# Patient Record
Sex: Male | Born: 1945 | Race: White | Hispanic: No | Marital: Single | Smoking: Former smoker
Health system: Southern US, Community
[De-identification: ages and names within clinical notes are randomized; demographics above are authoritative.]

## PROBLEM LIST (undated history)

## (undated) DIAGNOSIS — R0600 Dyspnea, unspecified: Secondary | ICD-10-CM

## (undated) DIAGNOSIS — R011 Cardiac murmur, unspecified: Secondary | ICD-10-CM

## (undated) DIAGNOSIS — J449 Chronic obstructive pulmonary disease, unspecified: Secondary | ICD-10-CM

## (undated) DIAGNOSIS — M1990 Unspecified osteoarthritis, unspecified site: Secondary | ICD-10-CM

## (undated) DIAGNOSIS — I6529 Occlusion and stenosis of unspecified carotid artery: Secondary | ICD-10-CM

## (undated) DIAGNOSIS — F419 Anxiety disorder, unspecified: Secondary | ICD-10-CM

## (undated) DIAGNOSIS — I959 Hypotension, unspecified: Secondary | ICD-10-CM

## (undated) HISTORY — PX: BLADDER SURGERY: SHX569

## (undated) HISTORY — PX: MOUTH SURGERY: SHX715

## (undated) HISTORY — DX: Occlusion and stenosis of unspecified carotid artery: I65.29

## (undated) HISTORY — DX: Chronic obstructive pulmonary disease, unspecified: J44.9

## (undated) HISTORY — DX: Hypotension, unspecified: I95.9

---

## 2017-08-01 ENCOUNTER — Encounter: Payer: Self-pay | Admitting: Internal Medicine

## 2017-08-01 ENCOUNTER — Telehealth: Payer: Self-pay

## 2017-08-01 ENCOUNTER — Ambulatory Visit (INDEPENDENT_AMBULATORY_CARE_PROVIDER_SITE_OTHER): Payer: Medicare Other | Admitting: Internal Medicine

## 2017-08-01 VITALS — BP 134/84 | HR 99 | Temp 97.7°F | Ht 67.0 in | Wt 115.4 lb

## 2017-08-01 DIAGNOSIS — G8929 Other chronic pain: Secondary | ICD-10-CM

## 2017-08-01 DIAGNOSIS — Z1211 Encounter for screening for malignant neoplasm of colon: Secondary | ICD-10-CM

## 2017-08-01 DIAGNOSIS — R5381 Other malaise: Secondary | ICD-10-CM | POA: Diagnosis not present

## 2017-08-01 DIAGNOSIS — R0609 Other forms of dyspnea: Secondary | ICD-10-CM | POA: Insufficient documentation

## 2017-08-01 DIAGNOSIS — Z136 Encounter for screening for cardiovascular disorders: Secondary | ICD-10-CM

## 2017-08-01 DIAGNOSIS — M549 Dorsalgia, unspecified: Secondary | ICD-10-CM | POA: Diagnosis not present

## 2017-08-01 DIAGNOSIS — R8290 Unspecified abnormal findings in urine: Secondary | ICD-10-CM

## 2017-08-01 DIAGNOSIS — R053 Chronic cough: Secondary | ICD-10-CM

## 2017-08-01 DIAGNOSIS — Z23 Encounter for immunization: Secondary | ICD-10-CM | POA: Diagnosis not present

## 2017-08-01 DIAGNOSIS — R011 Cardiac murmur, unspecified: Secondary | ICD-10-CM | POA: Diagnosis not present

## 2017-08-01 DIAGNOSIS — I739 Peripheral vascular disease, unspecified: Secondary | ICD-10-CM | POA: Diagnosis not present

## 2017-08-01 DIAGNOSIS — Z720 Tobacco use: Secondary | ICD-10-CM

## 2017-08-01 DIAGNOSIS — R0989 Other specified symptoms and signs involving the circulatory and respiratory systems: Secondary | ICD-10-CM | POA: Diagnosis not present

## 2017-08-01 DIAGNOSIS — R05 Cough: Secondary | ICD-10-CM

## 2017-08-01 LAB — URINALYSIS, ROUTINE W REFLEX MICROSCOPIC
BACTERIA UA: NONE SEEN /HPF
Bilirubin Urine: NEGATIVE
Glucose, UA: NEGATIVE
Hyaline Cast: NONE SEEN /LPF
KETONES UR: NEGATIVE
LEUKOCYTES UA: NEGATIVE
Nitrite: NEGATIVE
PROTEIN: NEGATIVE
SPECIFIC GRAVITY, URINE: 1.014 (ref 1.001–1.03)
SQUAMOUS EPITHELIAL / LPF: NONE SEEN /HPF (ref <=5)
WBC, UA: NONE SEEN /HPF (ref 0–5)
pH: 5.5 (ref 5.0–8.0)

## 2017-08-01 LAB — COMPLETE METABOLIC PANEL WITH GFR
AG RATIO: 1.2 (calc) (ref 1.0–2.5)
ALT: 9 U/L (ref 9–46)
AST: 15 U/L (ref 10–35)
Albumin: 4.1 g/dL (ref 3.6–5.1)
Alkaline phosphatase (APISO): 109 U/L (ref 40–115)
BILIRUBIN TOTAL: 0.3 mg/dL (ref 0.2–1.2)
BUN: 13 mg/dL (ref 7–25)
CALCIUM: 9.8 mg/dL (ref 8.6–10.3)
CHLORIDE: 100 mmol/L (ref 98–110)
CO2: 29 mmol/L (ref 20–32)
Creat: 1.04 mg/dL (ref 0.70–1.18)
GFR, EST NON AFRICAN AMERICAN: 72 mL/min/{1.73_m2} (ref >=60)
GFR, Est African American: 83 mL/min/{1.73_m2} (ref >=60)
GLUCOSE: 89 mg/dL (ref 65–99)
Globulin: 3.3 g/dL (calc) (ref 1.9–3.7)
POTASSIUM: 4.4 mmol/L (ref 3.5–5.3)
Sodium: 136 mmol/L (ref 135–146)
Total Protein: 7.4 g/dL (ref 6.1–8.1)

## 2017-08-01 LAB — CBC WITH DIFFERENTIAL/PLATELET
BASOS PCT: 1.2 %
Basophils Absolute: 90 cells/uL (ref 0–200)
Eosinophils Absolute: 128 cells/uL (ref 15–500)
Eosinophils Relative: 1.7 %
HCT: 44.1 % (ref 38.5–50.0)
Hemoglobin: 15.4 g/dL (ref 13.2–17.1)
Lymphs Abs: 1860 cells/uL (ref 850–3900)
MCH: 28.8 pg (ref 27.0–33.0)
MCHC: 34.9 g/dL (ref 32.0–36.0)
MCV: 82.4 fL (ref 80.0–100.0)
MONOS PCT: 10.7 %
MPV: 10.7 fL (ref 7.5–12.5)
NEUTROS PCT: 61.6 %
Neutro Abs: 4620 cells/uL (ref 1500–7800)
PLATELETS: 134 10*3/uL — AB (ref 140–400)
RBC: 5.35 10*6/uL (ref 4.20–5.80)
RDW: 12.8 % (ref 11.0–15.0)
TOTAL LYMPHOCYTE: 24.8 %
WBC: 7.5 10*3/uL (ref 3.8–10.8)
WBCMIX: 803 {cells}/uL (ref 200–950)

## 2017-08-01 LAB — LIPID PANEL
CHOLESTEROL: 202 mg/dL — AB (ref <200)
HDL: 52 mg/dL (ref >40)
LDL Cholesterol (Calc): 129 mg/dL (calc) — ABNORMAL HIGH
Non-HDL Cholesterol (Calc): 150 mg/dL (calc) — ABNORMAL HIGH (ref <130)
TRIGLYCERIDES: 105 mg/dL (ref <150)
Total CHOL/HDL Ratio: 3.9 (calc) (ref <5.0)

## 2017-08-01 LAB — TSH: TSH: 0.45 mIU/L (ref 0.40–4.50)

## 2017-08-01 NOTE — Patient Instructions (Signed)
Influenza vaccine given today  Will call with lab results and imaging study results  Will call with US appt  Follow up in 1 month for AWV/EV with MMSE and ECG

## 2017-08-01 NOTE — Telephone Encounter (Signed)
Spoke with Friend per emergancy contact she stated that patient has not seen a provider since 1990's.

## 2017-08-01 NOTE — Telephone Encounter (Signed)
Patient's friend Peter Castaneda stopped at the front desk to schedule follow-up appointment and inquired about getting patient linked to resources in the community such as meals on wheels.   I briefly informed Peter Castaneda about our [facility]3 Guide who is connected to resources in the community and she was interested and requested referral    Please review and complete pending referral if in agreement with request

## 2017-08-01 NOTE — Progress Notes (Signed)
Patient ID: Peter Castaneda, male   DOB: 1945-10-17, 71 y.o.   MRN: [id number]    Location:  [facility] Place of Service: OFFICE    Advanced Directive information    Chief Complaint  Patient presents with  . Establish Care    New Patient to establish care, Here with caregiver  . Immunizations    discuss vaccs     HPI:  71 yo male seen today as a new pt. He states he is unable to live alone anymore due to failing health. He has noticed over the last 6 mos, he is unable to walk >30 ft without getting SOB. He has difficulty performing ADLs (bathing, dressing, grooming, moving room-room, etc). He smokes 1 ppd x 64yr [facility] Family friend Celene present.  Hx bladder polyp s/p partial cystectomy - no further gross hematuria  Prior to retiring, he worked with classified gMedical laboratory scientific officer Past Medical History:  Diagnosis Date  . Low blood pressure    Entered from new patient packet     Past Surgical History:  Procedure Laterality Date  . BLADDER REMOVAL     Part of bladder removed, 1980's. Input from New Patient Packet     Patient Care Team: CGildardo Cranker DO as PCP - General (Internal Medicine)  Social History   Socioeconomic History  . Marital status: Single    Spouse name: Not on file  . Number of children: Not on file  . Years of education: Not on file  . Highest education level: Not on file  Social Needs  . Financial resource strain: Not on file  . Food insecurity - worry: Not on file  . Food insecurity - inability: Not on file  . Transportation needs - medical: Not on file  . Transportation needs - non-medical: Not on file  Occupational History  . Not on file  Tobacco Use  . Smoking status: Current Every Day Smoker    Packs/day: 1.00    Years: 50.00    Pack years: 50.00  . Smokeless tobacco: Never Used  Substance and Sexual Activity  . Alcohol use: No    Frequency: Never  . Drug use: No  . Sexual activity: Not on file  Other  Topics Concern  . 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 Yes   Do you have difficulty preparing food or eating? Yes   Do you have difficulty managing your medications? No   Do you have difficulty managing your finances? Yes   Do you have difficulty affording your medications? No     reports that he has been smoking.  He has a 50.00 pack-year smoking history. he has never used smokeless tobacco. He reports that he does not drink alcohol or use drugs.  Family History  Problem Relation  Age of Onset  . Heart disease Mother   . Cancer Mother   . Alzheimer's disease Father    Family Status  Relation Name Status  . Mother Stanton Kidney Deceased  . Father Eulas Post Deceased       Step Father  . Sister Bluford Kaufmann  . Brother Herbie Baltimore Alive     There is no immunization history on file for this patient.  No Known Allergies  Medications:   Medication List        Accurate as of 08/01/17  9:25 AM. Always use your most recent med list.          aspirin 325 MG EC tablet       Review of Systems  Constitutional: Positive for fatigue and unexpected weight change (wt loss).  HENT: Positive for dental problem (dentures), postnasal drip and sinus pressure.   Eyes: Positive for visual disturbance (wears eyeglasses).  Respiratory: Positive for cough.   Cardiovascular:       Leg pain with ambulation that resolves when he rests  Gastrointestinal:       Hx ulcer  Genitourinary: Positive for urgency.       Weak stream  Musculoskeletal: Positive for back  pain.  Skin:       Nail abnormality  Allergic/Immunologic: Positive for environmental allergies.  Neurological: Positive for weakness.  All other systems reviewed and are negative.   Vitals:   08/01/17 0854  BP: 134/84  Pulse: 99  Temp: 97.7 F (36.5 C)  TempSrc: Oral  SpO2: 96%  Weight: 115 lb 6.4 oz (52.3 kg)  Height: 5' 7"  (1.702 m)   Body mass index is 18.07 kg/m.  Physical Exam  Constitutional: He is oriented to person, place, and time. He appears well-developed.  Frail appearing in NAD  HENT:  Mouth/Throat: Oropharynx is clear and moist.  Poor dentition; MMM; no oral thrush  Eyes: Pupils are equal, round, and reactive to light. No scleral icterus.  Neck: Neck supple. Carotid bruit is not present. No thyromegaly present.  Cardiovascular: Normal rate, regular rhythm and intact distal pulses. Exam reveals no gallop and no friction rub.  Murmur (1/6 SEM) heard. Pulses:      Dorsalis pedis pulses are 0 on the right side, and 0 on the left side.       Posterior tibial pulses are 0 on the right side, and 0 on the left side.  No LE edema b/l. No calf TTP  Pulmonary/Chest: Effort normal. He has wheezes (end expiratory with prolonged expiratory phase). He has no rales. He exhibits no tenderness.  Abdominal: Soft. Normal appearance and bowel sounds are normal. He exhibits no distension, no abdominal bruit, no pulsatile midline mass and no mass. There is no hepatomegaly. There is no tenderness. There is no rigidity, no rebound and no guarding. No hernia.  Musculoskeletal: He exhibits edema.  Lymphadenopathy:    He has no cervical adenopathy.  Neurological: He is alert and oriented to person, place, and time. He has normal reflexes.  Skin: Skin is warm and dry. No rash noted.  Psychiatric: He has a normal mood and affect. His behavior is normal. Judgment and thought content normal.     Labs reviewed: No results found for any previous visit.    No results  found.   Assessment/Plan   ICD-10-CM   1. 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 Weak arterial pulse R09.89 VAS [place] ABI WITH/WO TBI  3. Chronic cough -  likely 2/2 #5 R05 DG Chest 2 View  4. Dyspnea on exertion - likely 2/2 #5 R06.09 DG Chest 2 View    CBC with Differential/Platelets    CMP with eGFR    TSH  5. Continuous tobacco abuse Z72.0 DG Chest 2 View  6. Physical deconditioning R53.81   7. Chronic back pain, unspecified back location, unspecified back pain laterality M54.9 Urinalysis with Reflex Microscopic   G89.29   8. Colon cancer screening Z12.11 Ambulatory referral to Gastroenterology  9. Screening for cardiovascular condition Z13.6 Lipid Panel  10. Cardiac murmur R01.1   11. Need for immunization against influenza Z23 Flu Vaccine QUAD 6+ mos PF IM (Fluarix Quad PF)    Will likely need 2D echo to further evaluate heart murmur  Influenza vaccine given today  Will call with lab results and imaging study results  Will call with [place] appt  Follow up in 1 month for AWV/EV with MMSE and ECG    Grant Henkes S. Perlie Gold  [facility] [address] [place], [place] [address] [phone] Cell (Monday-Friday 8 AM - 5 PM) [phone] After 5 PM and follow prompts

## 2017-08-03 ENCOUNTER — Encounter: Payer: Self-pay | Admitting: Physician Assistant

## 2017-08-13 ENCOUNTER — Ambulatory Visit: Payer: Self-pay | Admitting: Physician Assistant

## 2017-08-22 ENCOUNTER — Ambulatory Visit (INDEPENDENT_AMBULATORY_CARE_PROVIDER_SITE_OTHER): Payer: Medicare Other | Admitting: Physician Assistant

## 2017-08-22 ENCOUNTER — Encounter: Payer: Self-pay | Admitting: Physician Assistant

## 2017-08-22 VITALS — BP 88/60 | HR 116 | Ht 66.0 in | Wt 114.0 lb

## 2017-08-22 DIAGNOSIS — R1310 Dysphagia, unspecified: Secondary | ICD-10-CM | POA: Diagnosis not present

## 2017-08-22 DIAGNOSIS — R634 Abnormal weight loss: Secondary | ICD-10-CM

## 2017-08-22 DIAGNOSIS — Z1212 Encounter for screening for malignant neoplasm of rectum: Secondary | ICD-10-CM

## 2017-08-22 DIAGNOSIS — Z1211 Encounter for screening for malignant neoplasm of colon: Secondary | ICD-10-CM | POA: Diagnosis not present

## 2017-08-22 NOTE — Progress Notes (Signed)
Reviewed and agree with initial management plan.  Alyze Lauf T. Lory Nowaczyk, MD FACG 

## 2017-08-22 NOTE — Progress Notes (Signed)
Chief Complaint: Weight loss, dysphagia  HPI:    Mr. Peter Castaneda is a 71 year old Caucasian male with a past medical history as listed below, who was referred to me by Kirt Boysarter, Monica, DO for a complaint of dysphagia and weight loss.      Per review of chart patient is being worked up for claudication as he recently went to see a primary care provider for the first time" many years".    Patient presents to clinic accompanied by his caretaker and friend who does assist with his history.  Today, the patient explains that he has dropped from around 145 pounds down to 114 over the past 5 years ever since he retired from his job and had a "decrease in funds".  The patient tells me that he is just a "nibbler" and never eats a very large meal and does not have the money to afford large meals.  Patient denies any nausea, vomiting, heartburn, reflux or epigastric pain.  Patient tells me that he is hungry but does not have food to eat.    Patient does describe occasional dysphagia which happens "may be a couple times a week", the patient describes food getting stuck in his throat when he swallows it.  This typically will go down eventually.    Patient does describe that he "maybe had a colonoscopy about 30 years ago", at the same time that he had a cystoscopy to remove a benign tumor from his bladder.  He is not sure if this was done or not and "I zoned out when they started telling me everything they were going to do".    Patient denies fever, chills, blood in his stool, melena, change in bowel habits or abdominal pain.  Past Medical History:  Diagnosis Date  . Low blood pressure    Entered from new patient packet     Past Surgical History:  Procedure Laterality Date  . BLADDER SURGERY     tumor removal    Current Outpatient Medications  Medication Sig Dispense Refill  . multivitamin-iron-minerals-folic acid (CENTRUM) chewable tablet Chew 1 tablet by mouth daily.    Marland Kitchen. aspirin 325 MG EC tablet Take 325  mg daily by mouth.     No current facility-administered medications for this visit.     Allergies as of 08/22/2017  . (No Known Allergies)    Family History  Problem Relation Age of Onset  . Heart disease Mother   . Cancer Mother        Unknown type  . Alzheimer's disease Father   . Rectal cancer Neg Hx   . Colon cancer Neg Hx   . Stomach cancer Neg Hx     Social History   Socioeconomic History  . Marital status: Single    Spouse name: Not on file  . Number of children: Not on file  . Years of education: Not on file  . Highest education level: Not on file  Social Needs  . Financial resource strain: Not on file  . Food insecurity - worry: Not on file  . Food insecurity - inability: Not on file  . Transportation needs - medical: Not on file  . Transportation needs - non-medical: Not on file  Occupational History  . Not on file  Tobacco Use  . Smoking status: Current Every Day Smoker    Packs/day: 1.00    Years: 50.00    Pack years: 50.00  . Smokeless tobacco: Never Used  Substance and Sexual Activity  .  Alcohol use: No    Frequency: Never  . Drug use: No  . Sexual activity: Not Currently    Partners: Female  Other Topics Concern  . Not on file  Social History Narrative   As of 07/24/17:      Diet: N/A      Caffeine: Coffee      Married, if yes what year: N/A      Do you live in a house, apartment, assisted living, condo, trailer, ect: 5th wheel trailer, 1 stories, 1 person       Pets: 1 dog      Current/Past profession: tool and Scientist, forensicdie maker, Art gallery managerengineer       Exercise: No         Living Will: No   DNR: No   POA/HPOA: No      Functional Status:   Do you have difficulty bathing or dressing yourself? Yes   Do you have difficulty preparing food or eating? Yes   Do you have difficulty managing your medications? No   Do you have difficulty managing your finances? Yes   Do you have difficulty affording your medications? No    Review of Systems:      Constitutional: No fever or chills Skin: No rash  Cardiovascular: No chest pain Respiratory: No SOB Gastrointestinal: See HPI and otherwise negative Genitourinary: No dysuria Neurological: No headache Musculoskeletal: B./l leg numbness Hematologic: No bleeding or bruising Psychiatric: No history of depression or anxiety   Physical Exam:  Vital signs: BP (!) 88/60   Pulse (!) 116   Ht 5\' 6"  (1.676 m)   Wt 114 lb (51.7 kg)   BMI 18.40 kg/m   Constitutional:   Pleasant frail appearing Caucasian male appears to be in NAD, Well developed, alert and cooperative Head:  Normocephalic and atraumatic. Eyes:   PEERL, EOMI. No icterus. Conjunctiva pink. Ears:  Normal auditory acuity. Neck:  Supple Throat: Oral cavity and pharynx without inflammation, swelling or lesion. Poor dentition Respiratory: Respirations even and unlabored. Lungs clear to auscultation bilaterally.   No wheezes, crackles, or rhonchi.  Cardiovascular: Normal S1, S2. + murmur Regular rate and rhythm. No peripheral edema, cyanosis or pallor.  Gastrointestinal:  Soft, nondistended, nontender. No rebound or guarding. Normal bowel sounds. No appreciable masses or hepatomegaly. Rectal:  Not performed.  Msk:  Symmetrical without gross deformities. Without edema, no deformity or joint abnormality.  Neurologic:  Alert and  oriented x4;  grossly normal neurologically.  Skin:   Dry and intact without significant lesions or rashes. Psychiatric: Demonstrates good judgement and reason without abnormal affect or behaviors.  RELEVANT LABS AND IMAGING: CBC    Component Value Date/Time   WBC 7.5 08/01/2017 1010   RBC 5.35 08/01/2017 1010   HGB 15.4 08/01/2017 1010   HCT 44.1 08/01/2017 1010   PLT 134 (L) 08/01/2017 1010   MCV 82.4 08/01/2017 1010   MCH 28.8 08/01/2017 1010   MCHC 34.9 08/01/2017 1010   RDW 12.8 08/01/2017 1010   LYMPHSABS 1,860 08/01/2017 1010   EOSABS 128 08/01/2017 1010   BASOSABS 90 08/01/2017 1010     CMP     Component Value Date/Time   NA 136 08/01/2017 1010   K 4.4 08/01/2017 1010   CL 100 08/01/2017 1010   CO2 29 08/01/2017 1010   GLUCOSE 89 08/01/2017 1010   BUN 13 08/01/2017 1010   CREATININE 1.04 08/01/2017 1010   CALCIUM 9.8 08/01/2017 1010   PROT 7.4 08/01/2017 1010   AST  15 08/01/2017 1010   ALT 9 08/01/2017 1010   BILITOT 0.3 08/01/2017 1010   GFRNONAA 72 08/01/2017 1010   GFRAA 83 08/01/2017 1010    Assessment: 1.  Weight loss: About 30 pounds per the patient over the past 5 years since retiring from his job due to " decrease in funds"; I believe that this is most likely related to socioeconomic status and decreased ability to purchase food rather than GI symptoms 2.  Screening colonoscopy: Patient describes one may be 30 years ago 3.  New cardiac murmur and claudication: Currently undergoing workup, has follow with primary care provider in a month 4.  Dysphagia: Intermittent, 2 times a week, food gets stuck; consider esophageal stricture versus ring versus web versus mass  Plan: 1.  Discussed with the patient that due to finding of a new heart murmur and claudication by his PCP recently, would recommend that he have this all worked up before we schedule him for any anesthetic procedures. 2.  We will go ahead and order a barium swallow study with tablet to see if patient may benefit from an EGD in the future. 3.  Discussed patient's weight loss in depth, it appears this is just related to a decrease of intake 4.  Patient will need a colonoscopy in the future, will discuss at time of follow-up after imaging above.  Patient may follow with me.  He was assigned to Dr. Russella Dar today.  Hyacinth Meeker, PA-C [facility] 08/22/2017, 3:25 PM  Cc: Kirt Boys, DO

## 2017-08-22 NOTE — Patient Instructions (Signed)
You have been scheduled for a Barium Esophogram at [facility] ([address]) on 08/30/17 at 845 am. Please arrive 15 minutes prior to your appointment for registration, bring your insurance card and photo id. Make certain not to have anything to eat or drink 6 hours prior to your test. If you need to reschedule for any reason, please contact radiology at [phone] to do so. __________________________________________________________________ A barium swallow is an examination that concentrates on views of the esophagus. This tends to be a double contrast exam (barium and two liquids which, when combined, create a gas to distend the wall of the oesophagus) or single contrast (non-ionic iodine based). The study is usually tailored to your symptoms so a good history is essential. Attention is paid during the study to the form, structure and configuration of the esophagus, looking for functional disorders (such as aspiration, dysphagia, achalasia, motility and reflux) EXAMINATION You may be asked to change into a gown, depending on the type of swallow being performed. A radiologist and radiographer will perform the procedure. The radiologist will advise you of the type of contrast selected for your procedure and direct you during the exam. You will be asked to stand, sit or lie in several different positions and to hold a small amount of fluid in your mouth before being asked to swallow while the imaging is performed .In some instances you may be asked to swallow barium coated marshmallows to assess the motility of a solid food bolus. The exam can be recorded as a digital or video fluoroscopy procedure. POST PROCEDURE It will take 1-2 days for the barium to pass through your system. To facilitate this, it is important, unless otherwise directed, to increase your fluids for the next 24-48hrs and to resume your normal diet.  This test typically takes about 30 minutes to  perform. __________________________________________________________________________________

## 2017-08-23 ENCOUNTER — Encounter (HOSPITAL_COMMUNITY): Payer: Self-pay

## 2017-08-23 ENCOUNTER — Ambulatory Visit: Payer: Self-pay | Admitting: Physician Assistant

## 2017-08-24 ENCOUNTER — Ambulatory Visit (HOSPITAL_COMMUNITY)
Admission: RE | Admit: 2017-08-24 | Discharge: 2017-08-24 | Disposition: A | Discharge status: Home / Self Care | Payer: Medicare Other | Source: Ambulatory Visit | Attending: Internal Medicine | Admitting: Internal Medicine

## 2017-08-24 DIAGNOSIS — R0989 Other specified symptoms and signs involving the circulatory and respiratory systems: Secondary | ICD-10-CM | POA: Diagnosis not present

## 2017-08-24 DIAGNOSIS — I739 Peripheral vascular disease, unspecified: Secondary | ICD-10-CM | POA: Insufficient documentation

## 2017-08-24 DIAGNOSIS — F17210 Nicotine dependence, cigarettes, uncomplicated: Secondary | ICD-10-CM | POA: Insufficient documentation

## 2017-08-24 DIAGNOSIS — E785 Hyperlipidemia, unspecified: Secondary | ICD-10-CM | POA: Insufficient documentation

## 2017-08-30 ENCOUNTER — Ambulatory Visit
Admission: RE | Admit: 2017-08-30 | Discharge: 2017-08-30 | Disposition: A | Discharge status: Home / Self Care | Payer: Medicare Other | Source: Ambulatory Visit | Attending: Internal Medicine | Admitting: Internal Medicine

## 2017-08-30 ENCOUNTER — Ambulatory Visit
Admission: RE | Admit: 2017-08-30 | Discharge: 2017-08-30 | Disposition: A | Discharge status: Home / Self Care | Payer: Medicare Other | Source: Ambulatory Visit | Attending: Physician Assistant | Admitting: Physician Assistant

## 2017-08-30 DIAGNOSIS — R0609 Other forms of dyspnea: Secondary | ICD-10-CM

## 2017-08-30 DIAGNOSIS — R05 Cough: Secondary | ICD-10-CM | POA: Diagnosis not present

## 2017-08-30 DIAGNOSIS — Z720 Tobacco use: Secondary | ICD-10-CM

## 2017-08-30 DIAGNOSIS — R1310 Dysphagia, unspecified: Secondary | ICD-10-CM | POA: Diagnosis not present

## 2017-08-30 DIAGNOSIS — R053 Chronic cough: Secondary | ICD-10-CM

## 2017-08-30 DIAGNOSIS — R634 Abnormal weight loss: Secondary | ICD-10-CM

## 2017-09-03 ENCOUNTER — Encounter: Payer: Self-pay | Admitting: Internal Medicine

## 2017-09-03 DIAGNOSIS — J449 Chronic obstructive pulmonary disease, unspecified: Secondary | ICD-10-CM | POA: Insufficient documentation

## 2017-09-07 ENCOUNTER — Ambulatory Visit (INDEPENDENT_AMBULATORY_CARE_PROVIDER_SITE_OTHER): Payer: Medicare Other

## 2017-09-07 VITALS — BP 118/60 | HR 116 | Temp 97.3°F | Ht 66.0 in | Wt 117.0 lb

## 2017-09-07 DIAGNOSIS — E639 Nutritional deficiency, unspecified: Secondary | ICD-10-CM

## 2017-09-07 DIAGNOSIS — Z0000 Encounter for general adult medical examination without abnormal findings: Secondary | ICD-10-CM

## 2017-09-07 DIAGNOSIS — Z23 Encounter for immunization: Secondary | ICD-10-CM

## 2017-09-07 NOTE — Patient Instructions (Signed)
Mr. Peter Castaneda , Thank you for taking time to come for your Medicare Wellness Visit. I appreciate your ongoing commitment to your health goals. Please review the following plan we discussed and let me know if I can assist you in the future.   Screening recommendations/referrals: Colonoscopy due, please discuss with Dr Montez Moritaarter when you come next week Recommended yearly ophthalmology/optometry visit for glaucoma screening and checkup Recommended yearly dental visit for hygiene and checkup  Vaccinations: Influenza vaccine up to date. Due 2019 fall season Pneumococcal vaccine 13 given today. PNA 23 due 09/07/2018 Tdap vaccine due, will wait Shingles vaccine due, will wait    Advanced directives: Advance directive discussed with you today. I have provided a copy for you to complete at home and have notarized. Once this is complete please bring a copy in to our office so we can scan it into your chart.  Conditions/risks identified: None  Next appointment: Dr. Montez Moritaarter 09/11/2017 @ 2:45pm            Tyron RussellSara Saunders, RN 09/09/2018 @ 12:45pm  Preventive Care 65 Years and Older, Male Preventive care refers to lifestyle choices and visits with your health care provider that can promote health and wellness. What does preventive care include?  A yearly physical exam. This is also called an annual well check.  Dental exams once or twice a year.  Routine eye exams. Ask your health care provider how often you should have your eyes checked.  Personal lifestyle choices, including:  Daily care of your teeth and gums.  Regular physical activity.  Eating a healthy diet.  Avoiding tobacco and drug use.  Limiting alcohol use.  Practicing safe sex.  Taking low doses of aspirin every day.  Taking vitamin and mineral supplements as recommended by your health care provider. What happens during an annual well check? The services and screenings done by your health care provider during your annual well check  will depend on your age, overall health, lifestyle risk factors, and family history of disease. Counseling  Your health care provider may ask you questions about your:  Alcohol use.  Tobacco use.  Drug use.  Emotional well-being.  Home and relationship well-being.  Sexual activity.  Eating habits.  History of falls.  Memory and ability to understand (cognition).  Work and work Astronomerenvironment. Screening  You may have the following tests or measurements:  Height, weight, and BMI.  Blood pressure.  Lipid and cholesterol levels. These may be checked every 5 years, or more frequently if you are over 72 years old.  Skin check.  Lung cancer screening. You may have this screening every year starting at age 72 if you have a 30-pack-year history of smoking and currently smoke or have quit within the past 15 years.  Fecal occult blood test (FOBT) of the stool. You may have this test every year starting at age 72.  Flexible sigmoidoscopy or colonoscopy. You may have a sigmoidoscopy every 5 years or a colonoscopy every 10 years starting at age 72.  Prostate cancer screening. Recommendations will vary depending on your family history and other risks.  Hepatitis C blood test.  Hepatitis B blood test.  Sexually transmitted disease (STD) testing.  Diabetes screening. This is done by checking your blood sugar (glucose) after you have not eaten for a while (fasting). You may have this done every 1-3 years.  Abdominal aortic aneurysm (AAA) screening. You may need this if you are a current or former smoker.  Osteoporosis. You may be screened  starting at age 56 if you are at high risk. Talk with your health care provider about your test results, treatment options, and if necessary, the need for more tests. Vaccines  Your health care provider may recommend certain vaccines, such as:  Influenza vaccine. This is recommended every year.  Tetanus, diphtheria, and acellular pertussis  (Tdap, Td) vaccine. You may need a Td booster every 10 years.  Zoster vaccine. You may need this after age 29.  Pneumococcal 13-valent conjugate (PCV13) vaccine. One dose is recommended after age 13.  Pneumococcal polysaccharide (PPSV23) vaccine. One dose is recommended after age 63. Talk to your health care provider about which screenings and vaccines you need and how often you need them. This information is not intended to replace advice given to you by your health care provider. Make sure you discuss any questions you have with your health care provider. Document Released: 09/17/2015 Document Revised: 05/10/2016 Document Reviewed: 06/22/2015 Elsevier Interactive Patient Education  2017 [employer].  Fall Prevention in the Home Falls can cause injuries. They can happen to people of all ages. There are many things you can do to make your home safe and to help prevent falls. What can I do on the outside of my home?  Regularly fix the edges of walkways and driveways and fix any cracks.  Remove anything that might make you trip as you walk through a door, such as a raised step or threshold.  Trim any bushes or trees on the path to your home.  Use bright outdoor lighting.  Clear any walking paths of anything that might make someone trip, such as rocks or tools.  Regularly check to see if handrails are loose or broken. Make sure that both sides of any steps have handrails.  Any raised decks and porches should have guardrails on the edges.  Have any leaves, snow, or ice cleared regularly.  Use sand or salt on walking paths during winter.  Clean up any spills in your garage right away. This includes oil or grease spills. What can I do in the bathroom?  Use night lights.  Install grab bars by the toilet and in the tub and shower. Do not use towel bars as grab bars.  Use non-skid mats or decals in the tub or shower.  If you need to sit down in the shower, use a plastic, non-slip  stool.  Keep the floor dry. Clean up any water that spills on the floor as soon as it happens.  Remove soap buildup in the tub or shower regularly.  Attach bath mats securely with double-sided non-slip rug tape.  Do not have throw rugs and other things on the floor that can make you trip. What can I do in the bedroom?  Use night lights.  Make sure that you have a light by your bed that is easy to reach.  Do not use any sheets or blankets that are too big for your bed. They should not hang down onto the floor.  Have a firm chair that has side arms. You can use this for support while you get dressed.  Do not have throw rugs and other things on the floor that can make you trip. What can I do in the kitchen?  Clean up any spills right away.  Avoid walking on wet floors.  Keep items that you use a lot in easy-to-reach places.  If you need to reach something above you, use a strong step stool that has a grab  bar.  Keep electrical cords out of the way.  Do not use floor polish or wax that makes floors slippery. If you must use wax, use non-skid floor wax.  Do not have throw rugs and other things on the floor that can make you trip. What can I do with my stairs?  Do not leave any items on the stairs.  Make sure that there are handrails on both sides of the stairs and use them. Fix handrails that are broken or loose. Make sure that handrails are as long as the stairways.  Check any carpeting to make sure that it is firmly attached to the stairs. Fix any carpet that is loose or worn.  Avoid having throw rugs at the top or bottom of the stairs. If you do have throw rugs, attach them to the floor with carpet tape.  Make sure that you have a light switch at the top of the stairs and the bottom of the stairs. If you do not have them, ask someone to add them for you. What else can I do to help prevent falls?  Wear shoes that:  Do not have high heels.  Have rubber bottoms.  Are  comfortable and fit you well.  Are closed at the toe. Do not wear sandals.  If you use a stepladder:  Make sure that it is fully opened. Do not climb a closed stepladder.  Make sure that both sides of the stepladder are locked into place.  Ask someone to hold it for you, if possible.  Clearly mark and make sure that you can see:  Any grab bars or handrails.  First and last steps.  Where the edge of each step is.  Use tools that help you move around (mobility aids) if they are needed. These include:  Canes.  Walkers.  Scooters.  Crutches.  Turn on the lights when you go into a dark area. Replace any light bulbs as soon as they burn out.  Set up your furniture so you have a clear path. Avoid moving your furniture around.  If any of your floors are uneven, fix them.  If there are any pets around you, be aware of where they are.  Review your medicines with your doctor. Some medicines can make you feel dizzy. This can increase your chance of falling. Ask your doctor what other things that you can do to help prevent falls. This information is not intended to replace advice given to you by your health care provider. Make sure you discuss any questions you have with your health care provider. Document Released: 06/17/2009 Document Revised: 01/27/2016 Document Reviewed: 09/25/2014 Elsevier Interactive Patient Education  2017 [employer].

## 2017-09-07 NOTE — Progress Notes (Signed)
Subjective:   Peter Castaneda is a 72 y.o. male who presents for an Initial Medicare Annual Wellness Visit.      Objective:    Today's Vitals   09/07/17 1109  BP: 118/60  Pulse: (!) 116  Temp: (!) 97.3 F (36.3 C)  TempSrc: Oral  SpO2: 97%  Weight: 117 lb (53.1 kg)  Height: 5\' 6"  (1.676 m)   Body mass index is 18.88 kg/m.  Advanced Directives 09/07/2017  Does Patient Have a Medical Advance Directive? No  Would patient like information on creating a medical advance directive? Yes (MAU/Ambulatory/Procedural Areas - Information given)    Current Medications (verified) Outpatient Encounter Medications as of 09/07/2017  Medication Sig  . aspirin 325 MG EC tablet Take 325 mg daily by mouth.  . multivitamin-iron-minerals-folic acid (CENTRUM) chewable tablet Chew 1 tablet by mouth daily.   No facility-administered encounter medications on file as of 09/07/2017.     Allergies (verified) Patient has no known allergies.   History: Past Medical History:  Diagnosis Date  . Low blood pressure    Entered from new patient packet    Past Surgical History:  Procedure Laterality Date  . BLADDER SURGERY     tumor removal   Family History  Problem Relation Age of Onset  . Heart disease Mother   . Cancer Mother        Unknown type  . Alzheimer's disease Father   . Rectal cancer Neg Hx   . Colon cancer Neg Hx   . Stomach cancer Neg Hx    Social History   Socioeconomic History  . Marital status: Single    Spouse name: Not on file  . Number of children: Not on file  . Years of education: Not on file  . Highest education level: Not on file  Social Needs  . Financial resource strain: Not hard at all  . Food insecurity - worry: Never true  . Food insecurity - inability: Never true  . Transportation needs - medical: No  . Transportation needs - non-medical: No  Occupational History  . Not on file  Tobacco Use  . Smoking status: Current Every Day Smoker    Packs/day: 1.00      Years: 50.00    Pack years: 50.00  . Smokeless tobacco: Never Used  Substance and Sexual Activity  . Alcohol use: No    Frequency: Never  . Drug use: No  . Sexual activity: Not Currently    Partners: Female  Other Topics Concern  . Not on file  Social History Narrative   As of 07/24/17:      Diet: N/A      Caffeine: Coffee      Married, if yes what year: N/A      Do you live in a house, apartment, assisted living, condo, trailer, ect: 5th wheel trailer, 1 stories, 1 person       Pets: 1 dog      Current/Past profession: tool and Scientist, forensic, Art gallery manager       Exercise: No         Living Will: No   DNR: No   POA/HPOA: No      Functional Status:   Do you have difficulty bathing or dressing yourself? Yes   Do you have difficulty preparing food or eating? Yes   Do you have difficulty managing your medications? No   Do you have difficulty managing your finances? Yes   Do you have difficulty affording your medications?  No   Tobacco Counseling Ready to quit: Not Answered Counseling given: Not Answered   Clinical Intake:  Pre-visit preparation completed: No        Nutritional Risks: None  How often do you need to have someone help you when you read instructions, pamphlets, or other written materials from your doctor or pharmacy?: 2 - Rarely What is the last grade level you completed in school?: Some College  Interpreter Needed?: No  Information entered by :: Tyron Russell, RN  Activities of Daily Living In your present state of health, do you have any difficulty performing the following activities: 09/07/2017  Hearing? N  Vision? N  Difficulty concentrating or making decisions? N  Walking or climbing stairs? Y  Dressing or bathing? N  Doing errands, shopping? N  Preparing Food and eating ? N  Using the Toilet? N  In the past six months, have you accidently leaked urine? Y  Do you have problems with loss of bowel control? N  Managing your Medications? N   Managing your Finances? N  Housekeeping or managing your Housekeeping? N     Immunizations and Health Maintenance Immunization History  Administered Date(s) Administered  . Influenza,inj,Quad PF,6+ Mos 08/01/2017   Health Maintenance Due  Topic Date Due  . Hepatitis C Screening  [birth year]  . TETANUS/TDAP  11/15/1964  . COLONOSCOPY  11/16/1995  . PNA vac Low Risk Adult (1 of 2 - PCV13) 11/16/2010    Patient Care Team: Kirt Boys, DO as PCP - General (Internal Medicine)  Indicate any recent Medical Services you may have received from other than [facility] providers in the past year (date may be approximate).    Assessment:   This is a routine wellness examination for Peter Castaneda.  Hearing/Vision screen Hearing Screening Comments: Pt has no issues with hearing Vision Screening Comments: Patient does not see eye doctor but will make appointment  Dietary issues and exercise activities discussed: Current Exercise Habits: The patient does not participate in regular exercise at present, Exercise limited by: orthopedic condition(s)  Goals    . Patient Stated     Patient would like to stop smoking and increase walking.      Depression Screen PHQ 2/9 Scores 09/07/2017 08/01/2017  PHQ - 2 Score 0 0    Fall Risk Fall Risk  09/07/2017 08/01/2017  Falls in the past year? Yes Yes  Number falls in past yr: 1 2 or more  Injury with Fall? No No    Is the patient's home free of loose throw rugs in walkways, pet beds, electrical cords, etc?   yes      Grab bars in the bathroom? yes      Handrails on the stairs?   yes      Adequate lighting?   yes  Timed Get Up and Go performed: 20 seconds, fall risk  Cognitive Function:        Screening Tests Health Maintenance  Topic Date Due  . Hepatitis C Screening  1946/06/06  . TETANUS/TDAP  11/15/1964  . COLONOSCOPY  11/16/1995  . PNA vac Low Risk Adult (1 of 2 - PCV13) 11/16/2010  . INFLUENZA VACCINE  Completed    Qualifies for  Shingles Vaccine? Yes, educated and wants to wait to get it  Cancer Screenings: Lung: Low Dose CT Chest recommended if Age 24-80 years, 30 pack-year currently smoking OR have quit w/in 15years. Patient does qualify. Colorectal: due, in process of making appoinmtnet   Additional Screenings:  Hepatitis B/HIV/Syphillis: not  indicated Hepatitis C Screening: due, wants to discuss with doctor      Plan:    I have personally reviewed and addressed the Medicare Annual Wellness questionnaire and have noted the following in the patient's chart:  A. Medical and social history B. Use of alcohol, tobacco or illicit drugs  C. Current medications and supplements D. Functional ability and status E.  Nutritional status F.  Physical activity G. Advance directives H. List of other physicians I.  Hospitalizations, surgeries, and ER visits in previous 12 months J.  Vitals K. Screenings to include hearing, vision, cognitive, depression L. Referrals and appointments - none  In addition, I have reviewed and discussed with patient certain preventive protocols, quality metrics, and best practice recommendations. A written personalized care plan for preventive services as well as general preventive health recommendations were provided to patient.  See attached scanned questionnaire for additional information.   Signed,   Tyron RussellSara Saunders, RN Nurse Health Advisor   Quick Notes   Health Maintenance: Hep C screen, tdap and shingrix due, wants to wait. In process of scheduling colonoscopy. PNA 13 given today.      Abnormal Screen: Pulse 116. MMSE 27/30. Passed clock drawing     Patient Concerns: Would like to discuss colonoscopy and endoscopy appointments. Pt [facility]    Nurse Concerns: Smoking-pt stated he would like to quit and requested smoking cessation information.

## 2017-09-11 ENCOUNTER — Ambulatory Visit (INDEPENDENT_AMBULATORY_CARE_PROVIDER_SITE_OTHER): Payer: Medicare Other | Admitting: Internal Medicine

## 2017-09-11 ENCOUNTER — Encounter: Payer: Self-pay | Admitting: Internal Medicine

## 2017-09-11 VITALS — BP 102/60 | HR 111 | Temp 97.5°F | Resp 12 | Ht 66.0 in | Wt 115.0 lb

## 2017-09-11 DIAGNOSIS — Z7189 Other specified counseling: Secondary | ICD-10-CM

## 2017-09-11 DIAGNOSIS — R000 Tachycardia, unspecified: Secondary | ICD-10-CM

## 2017-09-11 DIAGNOSIS — I779 Disorder of arteries and arterioles, unspecified: Secondary | ICD-10-CM | POA: Diagnosis not present

## 2017-09-11 DIAGNOSIS — R5381 Other malaise: Secondary | ICD-10-CM | POA: Diagnosis not present

## 2017-09-11 DIAGNOSIS — I739 Peripheral vascular disease, unspecified: Secondary | ICD-10-CM

## 2017-09-11 DIAGNOSIS — R011 Cardiac murmur, unspecified: Secondary | ICD-10-CM | POA: Diagnosis not present

## 2017-09-11 DIAGNOSIS — I4510 Unspecified right bundle-branch block: Secondary | ICD-10-CM | POA: Diagnosis not present

## 2017-09-11 DIAGNOSIS — F17209 Nicotine dependence, unspecified, with unspecified nicotine-induced disorders: Secondary | ICD-10-CM | POA: Diagnosis not present

## 2017-09-11 DIAGNOSIS — Z716 Tobacco abuse counseling: Secondary | ICD-10-CM

## 2017-09-11 DIAGNOSIS — Z720 Tobacco use: Secondary | ICD-10-CM

## 2017-09-11 DIAGNOSIS — J449 Chronic obstructive pulmonary disease, unspecified: Secondary | ICD-10-CM

## 2017-09-11 MED ORDER — ALBUTEROL SULFATE HFA 108 (90 BASE) MCG/ACT IN AERS: 2.0000 | INHALATION_SPRAY | RESPIRATORY_TRACT | 6 refills | Status: DC | PRN

## 2017-09-11 MED ORDER — BUPROPION HCL ER (SR) 150 MG PO TB12: ORAL_TABLET | ORAL | 1 refills | Status: DC

## 2017-09-11 NOTE — Patient Instructions (Addendum)
START WELLBUTRIN SR 150MG  take 1 tab daily x 3 days then increase 1 tab 2 times daily  START RESCUE INHALER (ALBUTEROL HFA) to use as needed for cough, difficulty breathing and wehezing  Will call with vascular surgery appt  Will call with heart US appt  Continue Aspirin daily  Follow up in 1 month for tobacco abuse, PAOD, murmur

## 2017-09-11 NOTE — Progress Notes (Signed)
Patient ID: Peter Castaneda, male   DOB: 1945/11/12, 72 y.o.   MRN: [id number]   Location:  [facility]  Place of Service:  OFFICE  Provider: Elmon KirschnerMONICA S Rodrecus Belsky, DO  Patient Care Team: Kirt Boysarter, Antrell Tipler, DO as PCP - General (Internal Medicine)  Extended Emergency Contact Information Primary Emergency Contact: Flonnie HailstoneSelene, Barbara Home Phone: [phone] Relation: Friend  Code Status: FULL CODE Goals of Care: Advanced Directive information Advanced Directives 09/07/2017  Does Patient Have a Medical Advance Directive? No  Would patient like information on creating a medical advance directive? Yes (MAU/Ambulatory/Procedural Areas - Information given)     Chief Complaint  Patient presents with  . Medical Management of Chronic Issues    Extended visit, AWV completed on 09/07/17, MMSE 27/30, - depression, - fall risk. Here with friend Britta MccreedyBarbara  . Smoking Concerns    Discuss stop smoking   . Advance Care Planning    HPOA/Living Will     HPI: Patient is a 72 y.o. male seen in today for an extended visit. Reviewed AWV note from 09/07/17. MMSE 27/30. He saw GI and barium swallow revealed esophageal dysmotility and small sliding HH with inducible GE reflux.   He states he is unable to live alone anymore due to failing health. He has noticed over the last 6 mos, he is unable to walk >30 ft without getting SOB. He has difficulty performing ADLs (bathing, dressing, grooming, moving room-room, etc). He smokes 1 ppd x 5054yr. [facility] Family friend Celene present.  Hx bladder polyp s/p partial cystectomy - no further gross hematuria  Prior to retiring, he worked with [employer] contracts   [facility] 09/07/2017 08/01/2017  Decreased Interest 0 0  Down, Depressed, Hopeless 0 0  PHQ - 2 Score 0 0    Fall Risk  09/11/2017 09/07/2017 08/01/2017  Falls in the past year? Yes Yes Yes  Number falls in past yr: 1 1 2  or more  Injury with Fall? No No No   MMSE - Mini Mental  State Exam 09/07/2017  Orientation to time 5  Orientation to Place 5  Registration 3  Attention/ Calculation 4  Recall 2  Language- name 2 objects 2  Language- repeat 1  Language- follow 3 step command 2  Language- read & follow direction 1  Write a sentence 1  Copy design 1  Total score 27     Health Maintenance  Topic Date Due  . Hepatitis C Screening  01947/03/11  . TETANUS/TDAP  11/15/1964  . COLONOSCOPY  11/16/1995  . PNA vac Low Risk Adult (2 of 2 - PPSV23) 09/07/2018  . INFLUENZA VACCINE  Completed    Past Medical History:  Diagnosis Date  . Low blood pressure    Entered from new patient packet     Past Surgical History:  Procedure Laterality Date  . BLADDER SURGERY     tumor removal    Family History  Problem Relation Age of Onset  . Heart disease Mother   . Cancer Mother        Unknown type  . Alzheimer's disease Father   . Rectal cancer Neg Hx   . Colon cancer Neg Hx   . Stomach cancer Neg Hx    Family Status  Relation Name Status  . Mother Corrie DandyMary Deceased  . Father Sherrine MaplesGlenn Deceased       Step Father  . Sister Jason NestBarbara Alive  . Brother ToysRusobert Alive  . Neg Hx  (  Not Specified)    Social History   Socioeconomic History  . Marital status: Single    Spouse name: Not on file  . Number of children: Not on file  . Years of education: Not on file  . Highest education level: Not on file  Social Needs  . Financial resource strain: Not hard at all  . Food insecurity - worry: Never true  . Food insecurity - inability: Never true  . Transportation needs - medical: No  . Transportation needs - non-medical: No  Occupational History  . Not on file  Tobacco Use  . Smoking status: Current Every Day Smoker    Packs/day: 1.00    Years: 50.00    Pack years: 50.00  . Smokeless tobacco: Never Used  Substance and Sexual Activity  . Alcohol use: No    Frequency: Never  . Drug use: No  . Sexual activity: Not Currently    Partners: Female  Other Topics  Concern  . Not on file  Social History Narrative   As of 07/24/17:      Diet: N/A      Caffeine: Coffee      Married, if yes what year: N/A      Do you live in a house, apartment, assisted living, condo, trailer, ect: 5th wheel trailer, 1 stories, 1 person       Pets: 1 dog      Current/Past profession: tool and Scientist, forensic, Art gallery manager       Exercise: No         Living Will: No   DNR: No   POA/HPOA: No      Functional Status:   Do you have difficulty bathing or dressing yourself? Yes   Do you have difficulty preparing food or eating? Yes   Do you have difficulty managing your medications? No   Do you have difficulty managing your finances? Yes   Do you have difficulty affording your medications? No    No Known Allergies  Allergies as of 09/11/2017   No Known Allergies     Medication List        Accurate as of 09/11/17  3:21 PM. Always use your most recent med list.          aspirin 325 MG EC tablet Take 325 mg daily by mouth.   multivitamin-iron-minerals-folic acid chewable tablet Chew 1 tablet by mouth daily.        Review of Systems:  Review of Systems  HENT: Positive for trouble swallowing.   Respiratory: Positive for shortness of breath.   Musculoskeletal: Positive for arthralgias and gait problem.  All other systems reviewed and are negative.   Physical Exam: Vitals:   09/11/17 1442  BP: 102/60  Pulse: (!) 111  Resp: 12  Temp: (!) 97.5 F (36.4 C)  TempSrc: Oral  SpO2: 95%  Weight: 115 lb (52.2 kg)  Height: 5\' 6"  (1.676 m)   Body mass index is 18.56 kg/m. Physical Exam  Constitutional: He is oriented to person, place, and time. He appears well-developed and well-nourished. No distress.  Frail appearing in NAD  HENT:  Head: Normocephalic and atraumatic.  Right Ear: External ear normal.  Left Ear: External ear normal.  Mouth/Throat: Oropharynx is clear and moist. No oropharyngeal exudate.  MMM; no oral thrush  Eyes: EOM are normal.  Pupils are equal, round, and reactive to light. No scleral icterus.  Neck: Normal range of motion. Neck supple. Carotid bruit is not present. No tracheal deviation  present. No thyromegaly present.  Cardiovascular: Normal rate, regular rhythm and intact distal pulses. Exam reveals no gallop and no friction rub.  Murmur (1/6 SEM) heard. Pulses:      Carotid pulses are on the left side with bruit.      Dorsalis pedis pulses are 1+ on the right side, and 0 on the left side.       Posterior tibial pulses are 1+ on the right side, and 0 on the left side.  No LE edema b/l. No calf TTP  Pulmonary/Chest: Effort normal. No respiratory distress. He has wheezes (end expiratory with prolonged expiratory phase). He has no rales. He exhibits no tenderness.  No rhonchi  Abdominal: Soft. Bowel sounds are normal. He exhibits no distension and no mass. There is no hepatosplenomegaly or hepatomegaly. There is no tenderness. There is no rebound and no guarding. No hernia. Hernia confirmed negative in the right inguinal area and confirmed negative in the left inguinal area.  Genitourinary: Testes normal and penis normal. Right testis shows no mass, no swelling and no tenderness. Left testis shows no mass, no swelling and no tenderness. Circumcised.  Genitourinary Comments: Chaperone present  Musculoskeletal: He exhibits edema. He exhibits no deformity.  Lymphadenopathy:    He has no cervical adenopathy.       Right: No inguinal adenopathy present.       Left: No inguinal adenopathy present.  Neurological: He is alert and oriented to person, place, and time. He has normal reflexes.  Skin: Skin is warm and dry. No rash noted.  Psychiatric: He has a normal mood and affect. His behavior is normal. Judgment normal.  Vitals reviewed.   Labs reviewed:  Basic Metabolic Panel: Recent Labs    08/01/17 1010  NA 136  K 4.4  CL 100  CO2 29  GLUCOSE 89  BUN 13  CREATININE 1.04  CALCIUM 9.8  TSH 0.45   Liver  Function Tests: Recent Labs    08/01/17 1010  AST 15  ALT 9  BILITOT 0.3  PROT 7.4   No results for input(s): LIPASE, AMYLASE in the last 8760 hours. No results for input(s): AMMONIA in the last 8760 hours. CBC: Recent Labs    08/01/17 1010  WBC 7.5  NEUTROABS 4,620  HGB 15.4  HCT 44.1  MCV 82.4  PLT 134*   Lipid Panel: Recent Labs    08/01/17 1010  CHOL 202*  HDL 52  TRIG 105  CHOLHDL 3.9   No results found for: HGBA1C  Procedures: Dg Chest 2 View  Result Date: 08/30/2017 CLINICAL DATA:  Cough and congestion EXAM: CHEST  2 VIEW COMPARISON:  None. FINDINGS: Cardiac shadow is within normal limits. Aortic calcifications are noted. Lungs are hyperinflated consistent with COPD. Biapical pleuroparenchymal scarring is noted. No focal infiltrate or sizable effusion is seen. No acute bony abnormality is noted. IMPRESSION: COPD without acute abnormality. Electronically Signed   By: Alcide Clever M.D.   On: 08/30/2017 10:18   Dg Esophagus  Result Date: 08/30/2017 CLINICAL DATA:  Dysphagia. EXAM: ESOPHOGRAM/BARIUM SWALLOW TECHNIQUE: Single contrast examination was performed using  thin barium. FLUOROSCOPY TIME:  Fluoroscopy Time:  2 minutes and 12 seconds Radiation Exposure Index (if provided by the fluoroscopic device): 49 mGy Number of Acquired Spot Images: 0 COMPARISON:  None. FINDINGS: Initial barium swallows demonstrate normal pharyngeal motion with swallowing. No laryngeal penetration or aspiration. No upper esophageal webs, strictures or diverticuli. Moderate to advanced degenerative cervical spondylosis with multilevel disc disease and facet  disease. Anterior spurring changes without significant mass effect on the cervical esophagus. Esophageal dysmotility with occasional disruption of the primary peristaltic wave and occasional tertiary contractions. No intrinsic or extrinsic lesions are identified. No mucosal abnormalities. There is a small sliding-type hiatal hernia and  inducible GE reflux with water swallowing. The 13 mm barium pill passed into the stomach without difficulty. IMPRESSION: 1. Esophageal dysmotility. 2. Small sliding-type hiatal hernia with inducible GE reflux. 3. No mass or stricture. Electronically Signed   By: Rudie Meyer M.D.   On: 08/30/2017 09:54   ECG OBTAINED AND REVIEWED BY MYSELF: ST @ 108 bpm, nml axis, RBBB, poor R wave progression. No acute ischemic changes. No other ECG available to compare  Assessment/Plan   ICD-10-CM   1. Continuous tobacco abuse Z72.0 ECHOCARDIOGRAM COMPLETE  2. PAOD (peripheral arterial occlusive disease) (HCC) I77.9 Ambulatory referral to Vascular Surgery    ECHOCARDIOGRAM COMPLETE  3. Chronic obstructive pulmonary disease, unspecified COPD type (HCC) J44.9 albuterol (PROVENTIL HFA;VENTOLIN HFA) 108 (90 Base) MCG/ACT inhaler  4. Tachycardia R00.0 EKG 12-Lead    ECHOCARDIOGRAM COMPLETE  5. 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 Physical deconditioning R53.81   7. RBBB (right bundle branch block) I45.10 ECHOCARDIOGRAM COMPLETE  8. Cardiac murmur R01.1 ECHOCARDIOGRAM COMPLETE  9. Tobacco abuse counseling Z71.6   10. Tobacco use disorder, continuous F17.209   11. Encounter for smoking cessation counseling Z71.6   12. Advanced directives, counseling/discussion Z71.89     Discussed smoking cessation and tx options - he is willing to try pharmacologic tx. Discussed potential complications of smoking. TIME SPENT > 10 min.  START WELLBUTRIN SR 150MG  take 1 tab daily x 3 days then increase 1 tab 2 times daily  START RESCUE INHALER (ALBUTEROL HFA) to use as needed for cough, difficulty breathing and wehezing  Advanced directives discussed in detail - DNR vs FULL CoDE; MOST form; HCPOA, living will. He prefers FULL CODE at this time unless it is determined that he will not recover. Form provided. He will complete and notarize and return to the office. TIME  SPENT >30 min  Will call with vascular surgery appt  Will call with heart [place] appt  Continue Aspirin daily  FL2 form completed  Follow up in 1 month for tobacco abuse, PAOD, murmur      Filbert Craze S. Ancil Linsey  [facility] [address] [place], [place] [address] [phone] Cell (Monday-Friday 8 AM - 5 PM) [phone] After 5 PM and follow prompts

## 2017-09-12 ENCOUNTER — Other Ambulatory Visit: Payer: Self-pay

## 2017-09-12 DIAGNOSIS — R6889 Other general symptoms and signs: Secondary | ICD-10-CM

## 2017-09-12 DIAGNOSIS — I739 Peripheral vascular disease, unspecified: Secondary | ICD-10-CM

## 2017-09-14 ENCOUNTER — Encounter: Payer: Self-pay | Admitting: Internal Medicine

## 2017-09-14 DIAGNOSIS — I4510 Unspecified right bundle-branch block: Secondary | ICD-10-CM | POA: Insufficient documentation

## 2017-09-17 ENCOUNTER — Ambulatory Visit (HOSPITAL_COMMUNITY): Payer: Medicare Other | Attending: Cardiovascular Disease

## 2017-09-17 ENCOUNTER — Other Ambulatory Visit: Payer: Self-pay

## 2017-09-17 ENCOUNTER — Telehealth: Payer: Self-pay | Admitting: *Deleted

## 2017-09-17 DIAGNOSIS — I779 Disorder of arteries and arterioles, unspecified: Secondary | ICD-10-CM

## 2017-09-17 DIAGNOSIS — I4510 Unspecified right bundle-branch block: Secondary | ICD-10-CM | POA: Diagnosis not present

## 2017-09-17 DIAGNOSIS — I350 Nonrheumatic aortic (valve) stenosis: Secondary | ICD-10-CM | POA: Diagnosis not present

## 2017-09-17 DIAGNOSIS — R011 Cardiac murmur, unspecified: Secondary | ICD-10-CM

## 2017-09-17 DIAGNOSIS — Z720 Tobacco use: Secondary | ICD-10-CM | POA: Diagnosis not present

## 2017-09-17 DIAGNOSIS — F17210 Nicotine dependence, cigarettes, uncomplicated: Secondary | ICD-10-CM | POA: Diagnosis not present

## 2017-09-17 DIAGNOSIS — R000 Tachycardia, unspecified: Secondary | ICD-10-CM | POA: Diagnosis not present

## 2017-09-17 DIAGNOSIS — I517 Cardiomegaly: Secondary | ICD-10-CM | POA: Insufficient documentation

## 2017-09-17 NOTE — Telephone Encounter (Signed)
Selene notified. Left message with Dr. Celene Skeenarter's response.

## 2017-09-17 NOTE — Telephone Encounter (Signed)
Selene, Caregiver called and stated that she was confused about what she needed to do. Stated that patient needed Rehab but nothing was told of what kind of Rehab or how long, stated that she was just handed a list of facilities. Caregiver is wanting to know what she should be trying to do for him. Please Advise.

## 2017-09-17 NOTE — Telephone Encounter (Signed)
List provided to her to research which facility can accept him for short term rehab. We do not do the research on our end. The caregiver must do the research.

## 2017-09-19 ENCOUNTER — Encounter: Payer: Self-pay | Admitting: Surgery

## 2017-09-19 ENCOUNTER — Ambulatory Visit (HOSPITAL_COMMUNITY)
Admission: RE | Admit: 2017-09-19 | Discharge: 2017-09-19 | Disposition: A | Discharge status: Home / Self Care | Payer: Medicare Other | Source: Ambulatory Visit | Attending: Vascular Surgery | Admitting: Vascular Surgery

## 2017-09-19 ENCOUNTER — Ambulatory Visit (INDEPENDENT_AMBULATORY_CARE_PROVIDER_SITE_OTHER): Payer: Medicare Other | Admitting: Surgery

## 2017-09-19 VITALS — BP 127/88 | HR 95 | Temp 97.3°F | Resp 18 | Ht 66.0 in | Wt 118.0 lb

## 2017-09-19 DIAGNOSIS — I739 Peripheral vascular disease, unspecified: Secondary | ICD-10-CM | POA: Insufficient documentation

## 2017-09-19 DIAGNOSIS — I779 Disorder of arteries and arterioles, unspecified: Secondary | ICD-10-CM

## 2017-09-19 DIAGNOSIS — R6889 Other general symptoms and signs: Secondary | ICD-10-CM | POA: Diagnosis not present

## 2017-09-19 DIAGNOSIS — I70213 Atherosclerosis of native arteries of extremities with intermittent claudication, bilateral legs: Secondary | ICD-10-CM

## 2017-09-19 DIAGNOSIS — I6522 Occlusion and stenosis of left carotid artery: Secondary | ICD-10-CM | POA: Diagnosis not present

## 2017-09-19 LAB — VAS US LOWER EXTREMITY ARTERIAL DUPLEX
LSFDPSV: -25 cm/s
LSFMPSV: -48 cm/s
LSFPPSV: -65 cm/s
Left ant tibial distal sys: -7 cm/s
RIGHT ANT DIST TIBAL SYS PSV: 13 cm/s
RIGHT POST TIB DIST SYS: -16 cm/s
Right peroneal sys PSV: 7 cm/s
Right super femoral dist sys PSV: -28 cm/s
Right super femoral mid sys PSV: -57 cm/s
Right super femoral prox sys PSV: 51 cm/s
left post tibial dist sys: -8 cm/s

## 2017-09-19 NOTE — Progress Notes (Signed)
[facility]  Patient name: Peter Castaneda MRN: [id number] DOB: 09/16/1945 Sex: male   REQUESTING PROVIDER:    Dr. Montez Morita   REASON FOR CONSULT:    Abnormal ABI's  HISTORY OF PRESENT ILLNESS:   Peter Castaneda is a 72 y.o. male, who is referred for evaluation of claudication.  The patient states that he has had a left hip problem for a long time.  This does affect his walking.  He states that after walking certain distances his legs will give out.  He does not endorse calf cramping or thigh cramping with activity.  Most the time, his walking distance is limited by his shortness of breath.  He does not have any nonhealing wounds.  He does not have rest pain.  The patient suffers from COPD.  He has a 50+ pack year history of smoking.  PAST MEDICAL HISTORY    Past Medical History:  Diagnosis Date  . COPD (chronic obstructive pulmonary disease) (HCC)   . Low blood pressure    Entered from new patient packet      FAMILY HISTORY   Family History  Problem Relation Age of Onset  . Heart disease Mother   . Cancer Mother        Unknown type  . Alzheimer's disease Father   . Rectal cancer Neg Hx   . Colon cancer Neg Hx   . Stomach cancer Neg Hx     SOCIAL HISTORY:   Social History   Socioeconomic History  . Marital status: Single    Spouse name: Not on file  . Number of children: Not on file  . Years of education: Not on file  . Highest education level: Not on file  Social Needs  . Financial resource strain: Not hard at all  . Food insecurity - worry: Never true  . Food insecurity - inability: Never true  . Transportation needs - medical: No  . Transportation needs - non-medical: No  Occupational History  . Not on file  Tobacco Use  . Smoking status: Former Smoker    Packs/day: 1.00    Years: 50.00    Pack years: 50.00    Last attempt to quit: 09/10/2017    Years since quitting: 0.0  . Smokeless  tobacco: Never Used  Substance and Sexual Activity  . Alcohol use: No    Frequency: Never  . Drug use: No  . Sexual activity: Not Currently    Partners: Female  Other Topics Concern  . Not on file  Social History Narrative   As of 07/24/17:      Diet: N/A      Caffeine: Coffee      Married, if yes what year: N/A      Do you live in a house, apartment, assisted living, condo, trailer, ect: 5th wheel trailer, 1 stories, 1 person       Pets: 1 dog      Current/Past profession: tool and Scientist, forensic, Art gallery manager       Exercise: No         Living Will: No   DNR: No   POA/HPOA: No      Functional Status:   Do you have difficulty bathing or dressing yourself? Yes   Do you have difficulty preparing food or eating? Yes   Do you have difficulty managing your medications? No   Do you have difficulty managing your finances? Yes   Do you have difficulty affording your medications? No  ALLERGIES:    No Known Allergies  CURRENT MEDICATIONS:    Current Outpatient Medications  Medication Sig Dispense Refill  . aspirin 325 MG EC tablet Take 325 mg daily by mouth.    . multivitamin-iron-minerals-folic acid (CENTRUM) chewable tablet Chew 1 tablet by mouth daily.    Marland Kitchen. albuterol (PROVENTIL HFA;VENTOLIN HFA) 108 (90 Base) MCG/ACT inhaler Inhale 2 puffs into the lungs every 4 (four) hours as needed for wheezing or shortness of breath. (Patient not taking: Reported on 09/19/2017) 1 Inhaler 6  . buPROPion (WELLBUTRIN SR) 150 MG 12 hr tablet Take 1 tab po daily x 3 days then increase to 1 tab po BID for smoking cessation (Patient not taking: Reported on 09/19/2017) 60 tablet 1   No current facility-administered medications for this visit.     REVIEW OF SYSTEMS:   [X]  denotes positive finding, [ ]  denotes negative finding Cardiac  Comments:  Chest pain or chest pressure:    Shortness of breath upon exertion: x   Short of breath when lying flat:    Irregular heart rhythm:          Vascular    Pain in calf, thigh, or hip brought on by ambulation: x   Pain in feet at night that wakes you up from your sleep:     Blood clot in your veins:    Leg swelling:         Pulmonary    Oxygen at home:    Productive cough:  x   Wheezing:         Neurologic    Sudden weakness in arms or legs:     Sudden numbness in arms or legs:     Sudden onset of difficulty speaking or slurred speech:    Temporary loss of vision in one eye:     Problems with dizziness:         Gastrointestinal    Blood in stool:      Vomited blood:         Genitourinary    Burning when urinating:     Blood in urine:        Psychiatric    Major depression:         Hematologic    Bleeding problems:    Problems with blood clotting too easily:        Skin    Rashes or ulcers:        Constitutional    Fever or chills:     PHYSICAL EXAM:   There were no vitals filed for this visit.  GENERAL: The patient is a well-nourished male, in no acute distress. The vital signs are documented above. CARDIAC: There is a regular rate and rhythm.  VASCULAR: I cannot palpate femoral or pedal pulses.  He has a high-pitched left carotid bruit PULMONARY: Nonlabored respirations ABDOMEN: Soft and non-tender.  Aorta is palpable, however does not feel to be aneurysmal MUSCULOSKELETAL: There are no major deformities or cyanosis. NEUROLOGIC: No focal weakness or paresthesias are detected. SKIN: There are no ulcers or rashes noted. PSYCHIATRIC: The patient has a normal affect.  STUDIES:   I have reviewed his ABIs from today which are 0.58 on the right is 0.46 on the left both with monophasic waveforms.  Toe pressure on the right is 80, and 40 on the left.  ASSESSMENT and PLAN   Peripheral vascular disease: We discussed the natural history of lower extremity arterial insufficiency.  Currently, the patient's quality of life  is not limited by his lower extremity symptoms, but more limited by his shortness of  breath.  Therefore no intervention is recommended at this time.  We discussed the importance of medical management.  He has already stopped smoking 1 week ago.  I would like for him to be on a statin if we can find a medication that he cannot afford.  He should also be on a low dose aspirin.  I told the patient that I would like for him to get a carotid duplex to evaluate the carotid bruit on the left.  There are significant financial issues and therefore we will defer this to a later date.  To have him scheduled for follow-up in 1 year.  He does that if he develops a nonhealing wound or pain that wakes him up at night to contact me sooner.   Durene Cal, MD [facility] [phone] Pager [phone]

## 2017-09-24 ENCOUNTER — Other Ambulatory Visit: Payer: Self-pay

## 2017-09-24 DIAGNOSIS — R931 Abnormal findings on diagnostic imaging of heart and coronary circulation: Secondary | ICD-10-CM

## 2017-09-24 NOTE — Progress Notes (Deleted)
Understood but GI is not going to complete their work up of swallowing issue until he is cleared by cardiology. All of this impacts admission to SNF

## 2017-10-03 ENCOUNTER — Telehealth: Payer: Self-pay | Admitting: Internal Medicine

## 2017-10-03 ENCOUNTER — Ambulatory Visit: Payer: Medicare Other | Admitting: Cardiology

## 2017-10-03 ENCOUNTER — Encounter: Payer: Self-pay | Admitting: Cardiovascular Disease

## 2017-10-03 ENCOUNTER — Ambulatory Visit (INDEPENDENT_AMBULATORY_CARE_PROVIDER_SITE_OTHER): Payer: Medicare Other | Admitting: Cardiovascular Disease

## 2017-10-03 VITALS — BP 118/64 | HR 100 | Ht 67.0 in | Wt 120.0 lb

## 2017-10-03 DIAGNOSIS — R0989 Other specified symptoms and signs involving the circulatory and respiratory systems: Secondary | ICD-10-CM

## 2017-10-03 DIAGNOSIS — I739 Peripheral vascular disease, unspecified: Secondary | ICD-10-CM

## 2017-10-03 DIAGNOSIS — I350 Nonrheumatic aortic (valve) stenosis: Secondary | ICD-10-CM | POA: Diagnosis not present

## 2017-10-03 DIAGNOSIS — R0609 Other forms of dyspnea: Secondary | ICD-10-CM

## 2017-10-03 DIAGNOSIS — R011 Cardiac murmur, unspecified: Secondary | ICD-10-CM

## 2017-10-03 NOTE — Telephone Encounter (Signed)
I left a message to follow up on referral to [facility]. VDM (DD)

## 2017-10-03 NOTE — Patient Instructions (Signed)
Medication Instructions:   NO CHANGE  Testing/Procedures:  Your physician has requested that you have a carotid duplex. This test is an ultrasound of the carotid arteries in your neck. It looks at blood flow through these arteries that supply the brain with blood. Allow one hour for this exam. There are no restrictions or special instructions.   Your physician has requested that you have an echocardiogram. Echocardiography is a painless test that uses sound waves to create images of your heart. It provides your doctor with information about the size and shape of your heart and how well your heart's chambers and valves are working. This procedure takes approximately one hour. There are no restrictions for this procedure. SCHEDULE IN ONE YEAR   Follow-Up:  Your physician wants you to follow-up in: ONE YEAR WITH DR San MorelleBERRY You will receive a reminder letter in the mail two months in advance. If you don't receive a letter, please call our office to schedule the follow-up appointment.

## 2017-10-03 NOTE — Assessment & Plan Note (Signed)
Probably multifactorial but most likely related to COPD from long-standing tobacco abuse which he recently discontinued.

## 2017-10-03 NOTE — Progress Notes (Signed)
10/03/2017 Peter Castaneda   02/21/1946  [id number]  Primary Physician Peter Boys, Peter Castaneda Primary Cardiologist: Peter Gess Peter Milagros Loll, [place], [place]  HPI:  Peter Castaneda is a 72 y.o. visit is a 72 year old thin and chronically ill appearing single Caucasian male father of 2 children who is accompanied by his care taker Peter Castaneda. He was referred by Dr. Kirt Castaneda for cardiovascular evaluation. He did have a murmur with a recent 2-D echo showed mild aortic stenosis and normal LV function. His risk factors for heart disease include 50 pack years of tobacco abuse, recently stopped a month ago. He's never had a heart attack or stroke. Denies chest pain but does have chronic dyspnea. He also has chronic low branch block. Recent grossly Dopplers revealed ABIs in the 0.4 0.5 range bilaterally. Does see Dr. Myra Castaneda on 09/19/17 for peripheral vascular evaluation recommended conservative care.    Current Meds  Medication Sig  . aspirin 325 MG EC tablet Take 325 mg daily by mouth.  . multivitamin-iron-minerals-folic acid (CENTRUM) chewable tablet Chew 1 tablet by mouth daily.     No Known Allergies  Social History   Socioeconomic History  . Marital status: Single    Spouse name: Not on file  . Number of children: Not on file  . Years of education: Not on file  . Highest education level: Not on file  Social Needs  . Financial resource strain: Not hard at all  . Food insecurity - worry: Never true  . Food insecurity - inability: Never true  . Transportation needs - medical: No  . Transportation needs - non-medical: No  Occupational History  . Not on file  Tobacco Use  . Smoking status: Former Smoker    Packs/day: 1.00    Years: 50.00    Pack years: 50.00    Last attempt to quit: 09/10/2017    Years since quitting: 0.0  . Smokeless tobacco: Never Used  Substance and Sexual Activity  . Alcohol use: No    Frequency: Never  . Drug use: No  . Sexual activity: Not Currently   Partners: Female  Other Topics Concern  . Not on file  Social History Narrative   As of 07/24/17:      Diet: N/A      Caffeine: Coffee      Married, if yes what year: N/A      Peter Castaneda you live in a house, apartment, assisted living, condo, trailer, ect: 5th wheel trailer, 1 stories, 1 person       Pets: 1 dog      Current/Past profession: tool and Scientist, forensic, Art gallery manager       Exercise: No         Living Will: No   DNR: No   POA/HPOA: No      Functional Status:   Peter Castaneda you have difficulty bathing or dressing yourself? Yes   Peter Castaneda you have difficulty preparing food or eating? Yes   Peter Castaneda you have difficulty managing your medications? No   Peter Castaneda you have difficulty managing your finances? Yes   Peter Castaneda you have difficulty affording your medications? No     Review of Systems: General: negative for chills, fever, night sweats or weight changes.  Cardiovascular: negative for chest pain, dyspnea on exertion, edema, orthopnea, palpitations, paroxysmal nocturnal dyspnea or shortness of breath Dermatological: negative for rash Respiratory: negative for cough or wheezing Urologic: negative for hematuria Abdominal: negative for nausea, vomiting, diarrhea, bright red blood per rectum, melena,  or hematemesis Neurologic: negative for visual changes, syncope, or dizziness All other systems reviewed and are otherwise negative except as noted above.    Blood pressure 118/64, pulse 100, height 5\' 7"  (1.702 m), weight 120 lb (54.4 kg), SpO2 96 %.  General appearance: alert and no distress Neck: no adenopathy, no JVD, supple, symmetrical, trachea midline and thyroid not enlarged, symmetric, no tenderness/mass/nodules Lungs: clear to auscultation bilaterally Heart: regular rate and rhythm, S1, S2 normal, no murmur, click, rub or gallop Extremities: extremities normal, atraumatic, no cyanosis or edema Pulses: absent pedal pulses Skin: Skin color, texture, turgor normal. No rashes or lesions Neurologic: Alert  and oriented X 3, normal strength and tone. Normal symmetric reflexes. Normal coordination and gait  EKG not performed today  ASSESSMENT AND PLAN:   Claudication of both lower extremities (HCC) History of claudication with Dopplers suggest severe occlusive disease with ABIs in the 0.4-0.5 range bilaterally. He did see Dr. Myra GianottiBrabham recently a 09/19/17 who recommended conservative care  Dyspnea on exertion Probably multifactorial but most likely related to COPD from long-standing tobacco abuse which he recently discontinued.  Cardiac murmur Mr. Peter Castaneda has mild aortic stenosis by recent 2-D echocardiogram performed 09/17/17 with a valve area 1.2 cm and a peak gradient of 27 mmHg. We will repeat this on an annual basis.      Peter GessJonathan J. Blake Castaneda Peter Castaneda,Peter Castaneda,Peter Castaneda, Peter Castaneda 10/03/2017 3:36 PM

## 2017-10-03 NOTE — Assessment & Plan Note (Signed)
Mr. Peter Castaneda has mild aortic stenosis by recent 2-D echocardiogram performed 09/17/17 with a valve area 1.2 cm and a peak gradient of 27 mmHg. We will repeat this on an annual basis.

## 2017-10-03 NOTE — Assessment & Plan Note (Signed)
History of claudication with Dopplers suggest severe occlusive disease with ABIs in the 0.4-0.5 range bilaterally. He did see Dr. Myra GianottiBrabham recently a 09/19/17 who recommended conservative care

## 2017-10-10 ENCOUNTER — Ambulatory Visit (HOSPITAL_COMMUNITY)
Admission: RE | Admit: 2017-10-10 | Discharge: 2017-10-10 | Disposition: A | Discharge status: Home / Self Care | Payer: Medicare Other | Source: Ambulatory Visit | Attending: Cardiovascular Disease | Admitting: Cardiovascular Disease

## 2017-10-10 ENCOUNTER — Telehealth: Payer: Self-pay | Admitting: Cardiovascular Disease

## 2017-10-10 DIAGNOSIS — I6523 Occlusion and stenosis of bilateral carotid arteries: Secondary | ICD-10-CM | POA: Diagnosis not present

## 2017-10-10 DIAGNOSIS — R0989 Other specified symptoms and signs involving the circulatory and respiratory systems: Secondary | ICD-10-CM

## 2017-10-10 NOTE — Telephone Encounter (Signed)
Per Dr. Allyson SabalBerry, pt needs appt for f/u of doppler done today. Called pt to schedule appt. Barabara selene answered and stated she schedules his appointments. She stated she does not have her schedule beyond 2/17. Explained no available appts until 2/22.   She stated she will call back to schedule appt for pt when she receives her schedule. Verbalized thanks for the call.

## 2017-10-16 ENCOUNTER — Ambulatory Visit: Payer: Medicare Other | Admitting: Internal Medicine

## 2017-10-17 ENCOUNTER — Ambulatory Visit (INDEPENDENT_AMBULATORY_CARE_PROVIDER_SITE_OTHER): Payer: Medicare Other | Admitting: Internal Medicine

## 2017-10-17 ENCOUNTER — Encounter: Payer: Self-pay | Admitting: Internal Medicine

## 2017-10-17 VITALS — BP 118/70 | HR 85 | Temp 97.9°F | Resp 10 | Ht 67.0 in | Wt 127.0 lb

## 2017-10-17 DIAGNOSIS — Z87891 Personal history of nicotine dependence: Secondary | ICD-10-CM | POA: Diagnosis not present

## 2017-10-17 DIAGNOSIS — J449 Chronic obstructive pulmonary disease, unspecified: Secondary | ICD-10-CM

## 2017-10-17 DIAGNOSIS — Z79899 Other long term (current) drug therapy: Secondary | ICD-10-CM | POA: Diagnosis not present

## 2017-10-17 DIAGNOSIS — E782 Mixed hyperlipidemia: Secondary | ICD-10-CM | POA: Diagnosis not present

## 2017-10-17 DIAGNOSIS — I350 Nonrheumatic aortic (valve) stenosis: Secondary | ICD-10-CM | POA: Diagnosis not present

## 2017-10-17 DIAGNOSIS — I779 Disorder of arteries and arterioles, unspecified: Secondary | ICD-10-CM | POA: Diagnosis not present

## 2017-10-17 DIAGNOSIS — Z111 Encounter for screening for respiratory tuberculosis: Secondary | ICD-10-CM

## 2017-10-17 MED ORDER — ALBUTEROL SULFATE HFA 108 (90 BASE) MCG/ACT IN AERS: 2.0000 | INHALATION_SPRAY | RESPIRATORY_TRACT | 6 refills | Status: AC | PRN

## 2017-10-17 MED ORDER — ATORVASTATIN CALCIUM 10 MG PO TABS: 10.0000 mg | ORAL_TABLET | Freq: Every day | ORAL | 6 refills | Status: AC

## 2017-10-17 NOTE — Addendum Note (Signed)
Addended by: Maurice SmallBEATTY, Cimone Fahey C on: 10/17/2017 03:29 PM   Modules accepted: Orders

## 2017-10-17 NOTE — Progress Notes (Signed)
Patient ID: Peter Castaneda, male   DOB: [birth year], 72 y.o.   MRN: [id number]   [facility]  Provider: DR Elmon KirschnerMONICA S Callista Hoh  Code Status:  Goals of Care:  Advanced Directives 09/07/2017  Does Patient Have a Medical Advance Directive? No  Would patient like information on creating a medical advance directive? Yes (MAU/Ambulatory/Procedural Areas - Information given)     Chief Complaint  Patient presents with  . Medical Management of Chronic Issues    1 month follow-up   . FL2    Form completion[facility] Patient has not selected a facility yet, although he has some in mind     HPI: Patient is a 72 y.o. male seen today for medical management of chronic diseases.  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 He has a f/u with cardio next month to discuss next steps. LDL 129 in 07/2017. He is not on a statin.  tobacco abuse - he quit smoking 2 days after his ov and never needed wellbutrin.   AS - followed by cardio Dr Allyson SabalBerry  PAD - severe occlusive. Followed by vasc sx. ABIs 0.4-0.5. He takes ASA daily  Per past notes - "He states he is unable to live alone anymore due to failing health. He has noticed over the last 6 mos, he is unable to walk >30 ft without getting SOB. He has difficulty performing ADLs (bathing, dressing, grooming, moving room-room, etc). He smokes 1 ppd x 1172yr. [facility] Family friend Celene present." MMSE 27/30   Past Medical History:  Diagnosis Date  . COPD (chronic obstructive pulmonary disease) (HCC)   . Low blood pressure    Entered from new patient packet     Past Surgical History:  Procedure Laterality Date  . BLADDER SURGERY     tumor removal     reports that he quit smoking about 5 weeks ago. He has a 50.00 pack-year smoking history. he has never used smokeless tobacco. He reports that he does not  drink alcohol or use drugs. Social History   Socioeconomic History  . Marital status: Single    Spouse name: Not on file  . Number of children: Not on file  . Years of education: Not on file  . Highest education level: Not on file  Social Needs  . Financial resource strain: Not hard at all  . Food insecurity - worry: Never true  . Food insecurity - inability: Never true  . Transportation needs - medical: No  . Transportation needs - non-medical: No  Occupational History  . Not on file  Tobacco Use  . Smoking status: Former Smoker    Packs/day: 1.00    Years: 50.00    Pack years: 50.00    Last attempt to quit: 09/10/2017    Years since quitting: 0.1  . Smokeless tobacco: Never Used  Substance and Sexual Activity  . Alcohol use: No    Frequency: Never  . Drug use: No  . Sexual activity: Not Currently    Partners: Female  Other Topics Concern  . Not on file  Social History Narrative   As of 07/24/17:      Diet: N/A      Caffeine: Coffee      Married, if yes what year: N/A      Do you live in a house, apartment,  assisted living, condo, trailer, ect: 5th wheel trailer, 1 stories, 1 person       Pets: 1 dog      Current/Past profession: tool and Scientist, forensic, Art gallery manager       Exercise: No         Living Will: No   DNR: No   POA/HPOA: No      Functional Status:   Do you have difficulty bathing or dressing yourself? Yes   Do you have difficulty preparing food or eating? Yes   Do you have difficulty managing your medications? No   Do you have difficulty managing your finances? Yes   Do you have difficulty affording your medications? No    Family History  Problem Relation Age of Onset  . Heart disease Mother   . Cancer Mother        Unknown type  . Alzheimer's disease Father   . Rectal cancer Neg Hx   . Colon cancer Neg Hx   . Stomach cancer Neg Hx     No Known Allergies  Outpatient Encounter Medications as of 10/17/2017  Medication Sig  . aspirin 325 MG EC  tablet Take 325 mg daily by mouth.  . multivitamin-iron-minerals-folic acid (CENTRUM) chewable tablet Chew 1 tablet by mouth daily.  . [DISCONTINUED] albuterol (PROVENTIL HFA;VENTOLIN HFA) 108 (90 Base) MCG/ACT inhaler Inhale 2 puffs into the lungs every 4 (four) hours as needed for wheezing or shortness of breath. (Patient not taking: Reported on 09/19/2017)  . [DISCONTINUED] buPROPion (WELLBUTRIN SR) 150 MG 12 hr tablet Take 1 tab po daily x 3 days then increase to 1 tab po BID for smoking cessation (Patient not taking: Reported on 09/19/2017)   No facility-administered encounter medications on file as of 10/17/2017.     Review of Systems:  Review of Systems  Respiratory: Positive for cough and shortness of breath.   Musculoskeletal: Positive for gait problem.  Neurological: Positive for weakness.  All other systems reviewed and are negative.   Health Maintenance  Topic Date Due  . Hepatitis C Screening  09/16/1945  . COLONOSCOPY  12/02/2017 (Originally 11/16/1995)  . TETANUS/TDAP  10/17/2018 (Originally 11/15/1964)  . PNA vac Low Risk Adult (2 of 2 - PPSV23) 09/07/2018  . INFLUENZA VACCINE  Completed    Physical Exam: Vitals:   10/17/17 1425  BP: 118/70  Pulse: 85  Resp: 10  Temp: 97.9 F (36.6 C)  TempSrc: Oral  SpO2: 96%  Weight: 127 lb (57.6 kg)  Height: 5\' 7"  (1.702 m)   Body mass index is 19.89 kg/m. Physical Exam  Constitutional: He is oriented to person, place, and time.  Frail appearing in NAD  Neck: Carotid bruit is present (L>R).  Cardiovascular: Normal rate and regular rhythm.  Murmur heard.  Systolic murmur is present with a grade of 2/6. No LE edema b/l  Pulmonary/Chest: Effort normal. He has decreased breath sounds. He has no wheezes (but has prolonged expiratory phase). He has no rhonchi. He has no rales.  Musculoskeletal: He exhibits edema.  Neurological: He is alert and oriented to person, place, and time.  Skin: Skin is warm and dry. No rash noted.    Psychiatric: He has a normal mood and affect. His behavior is normal. Judgment and thought content normal.    Labs reviewed: Basic Metabolic Panel: Recent Labs    08/01/17 1010  NA 136  K 4.4  CL 100  CO2 29  GLUCOSE 89  BUN 13  CREATININE 1.04  CALCIUM 9.8  TSH 0.45   Liver Function Tests: Recent Labs    08/01/17 1010  AST 15  ALT 9  BILITOT 0.3  PROT 7.4   No results for input(s): LIPASE, AMYLASE in the last 8760 hours. No results for input(s): AMMONIA in the last 8760 hours. CBC: Recent Labs    08/01/17 1010  WBC 7.5  NEUTROABS 4,620  HGB 15.4  HCT 44.1  MCV 82.4  PLT 134*   Lipid Panel: Recent Labs    08/01/17 1010  CHOL 202*  HDL 52  TRIG 105  CHOLHDL 3.9   No results found for: HGBA1C  Procedures since last visit: Vas US Carotid  Result Date: 10/10/2017 Carotid Arterial Duplex Study Indications:                            Bruit. Risk Factors:                           Past history of smoking. Pre-Surgical Evaluation & Surgical      Stenosis at birfurcation only. ICA is Correlation:                            normal past the stenosis. Anatomy is                                         within normal limits. Bifurcation is                                         located near the Hyoid Notch. Examination Guidelines: A complete evaluation includes B-mode imaging, spectral doppler, color doppler, and power doppler as needed of all accessible portions of each vessel. Bilateral testing is considered an integral part of a complete examination. Limited examinations for reoccurring indications may be performed as noted.  Right Carotid Findings: +----------+--------+--------+--------+------------+---------+           PSV cm/sEDV cm/sStenosisDescribe    Comments  +----------+--------+--------+--------+------------+---------+ CCA Prox  103     15              heterogenous          +----------+--------+--------+--------+------------+---------+ CCA  Distal163     24              heterogenous          +----------+--------+--------+--------+------------+---------+ ICA Prox  -215    -48     40-59%  heterogenousShadowing +----------+--------+--------+--------+------------+---------+ ICA Mid   -150    -29                                   +----------+--------+--------+--------+------------+---------+ ICA Distal-118    -32                                   +----------+--------+--------+--------+------------+---------+ ECA       -248    0               heterogenous          +----------+--------+--------+--------+------------+---------+ +----------+--------+-------+----------+-------------------+  PSV cm/sEDV cmsDescribe  Arm Pressure (mmHG) +----------+--------+-------+----------+-------------------+ Subclavian150     0      monophasic160                 +----------+--------+-------+----------+-------------------+ +---------+--------+----+--------+---+---------+ VertebralPSV cm/s-118EDV cm/s-22Antegrade +---------+--------+----+--------+---+---------+  Left Carotid Findings: +----------+--------+--------+--------+------------+---------+           PSV cm/sEDV cm/sStenosisDescribe    Comments  +----------+--------+--------+--------+------------+---------+ CCA Prox  110     16              heterogenous          +----------+--------+--------+--------+------------+---------+ CCA Mid   81      17              heterogenous          +----------+--------+--------+--------+------------+---------+ CCA Distal90      17              heterogenous          +----------+--------+--------+--------+------------+---------+ ICA Prox  415     118     80-99%  heterogenousShadowing +----------+--------+--------+--------+------------+---------+ ICA Mid   -227    -41                                   +----------+--------+--------+--------+------------+---------+ ICA Distal-86     -29                                    +----------+--------+--------+--------+------------+---------+ ECA       -266    -15             heterogenous          +----------+--------+--------+--------+------------+---------+ +----------+--------+--------+----------+-------------------+ SubclavianPSV cm/sEDV cm/sDescribe  Arm Pressure (mmHG) +----------+--------+--------+----------+-------------------+           89      4       monophasic124                 +----------+--------+--------+----------+-------------------+ +---------+--------+--------+---------------+ VertebralPSV cm/sEDV cm/sBi- directional +---------+--------+--------+---------------+  Findings reported to Dr. Allyson Sabal at 3:10. Final Interpretation: Right Carotid: Velocities in the right ICA are consistent with a 40-59%                stenosis. Non-hemodynamically significant plaque <50% noted in                the CCA. Left Carotid: Velocities in the left ICA are consistent with a 80-99% stenosis.               Non-hemodynamically significant plaque noted in the CCA. Vertebrals:  Right vertebral artery was patent with antegrade flow. A partial              left subclavian artery steal was noted. Subclavians: Monophasic waveforms in both subclavian arteries. *See table(s) above for measurements and observations.  Vascular consult recommended. Electronically signed by Lance Muss on 10/10/2017 at 5:41:46 PM.  Final   Assessment/Plan   ICD-10-CM   1. Mixed hyperlipidemia E78.2 atorvastatin (LIPITOR) 10 MG tablet    Lipid Panel    ALT  2. High risk medication use Z79.899 ALT  3. PAOD (peripheral arterial occlusive disease) (HCC) I77.9   4. Chronic obstructive pulmonary disease, unspecified COPD type (HCC) J44.9 albuterol (PROVENTIL HFA;VENTOLIN HFA) 108 (90 Base) MCG/ACT inhaler  5. Aortic valve stenosis, etiology of cardiac valve disease unspecified I35.0   6. History of tobacco abuse  Z61.096     START ATORVASTATIN 10MG  DAILY  FOR CHOLESTEROL  Continue other medications as ordered  Follow up with specialists as scheduled  CONGRATS ON SMOKING CESSATION  Use inhaler as needed for wheezing, shortness of breath  Follow up in 3 mos for PAD, hyperlipidemia, hx tobacco abuse, deconditioning.  FASTING LAB APPT IN 1 MONTH  PPD placed today and to be read in 48hrs  FL2 form completed, he qualifies for ALF   [facility] S. Ancil Linsey  [facility] [address] [place], [place] [address] [phone] Cell (Monday-Friday 8 AM - 5 PM) [phone] After 5 PM and follow prompts

## 2017-10-17 NOTE — Patient Instructions (Addendum)
START ATORVASTATIN 10MG  DAILY FOR CHOLESTEROL  Continue other medications as ordered  Follow up with specialists as scheduled  CONGRATS ON SMOKING CESSATION  Use inhaler as needed for wheezing, shortness of breath  Follow up in 3 mos for PAD, hyperlipidemia, hx tobacco abuse, deconditioning.  FASTING LAB APPT IN 1 MONTH   Cholesterol Cholesterol is a fat. Your body needs a small amount of cholesterol. Cholesterol (plaque) may build up in your blood vessels (arteries). That makes you more likely to have a heart attack or stroke. You cannot feel your cholesterol level. Having a blood test is the only way to find out if your level is high. Keep your test results. Work with your doctor to keep your cholesterol at a good level. What do the results mean?  Total cholesterol is how much cholesterol is in your blood.  LDL is bad cholesterol. This is the type that can build up. Try to have low LDL.  HDL is good cholesterol. It cleans your blood vessels and carries LDL away. Try to have high HDL.  Triglycerides are fat that the body can store or burn for energy. What are good levels of cholesterol?  Total cholesterol below 200.  LDL below 100 is good for people who have health risks. LDL below 70 is good for people who have very high risks.  HDL above 40 is good. It is best to have HDL of 60 or higher.  Triglycerides below 150. How can I lower my cholesterol? Diet Follow your diet program as told by your doctor.  Choose fish, white meat chicken, or [place]key that is roasted or baked. Try not to eat red meat, fried foods, sausage, or lunch meats.  Eat lots of fresh fruits and vegetables.  Choose whole grains, beans, pasta, potatoes, and cereals.  Choose olive oil, corn oil, or canola oil. Only use small amounts.  Try not to eat butter, mayonnaise, shortening, or palm kernel oils.  Try not to eat foods with trans fats.  Choose low-fat or nonfat dairy foods. ? Drink skim or nonfat  milk. ? Eat low-fat or nonfat yogurt and cheeses. ? Try not to drink whole milk or cream. ? Try not to eat ice cream, egg yolks, or full-fat cheeses.  Healthy desserts include angel food cake, ginger snaps, animal crackers, hard candy, popsicles, and low-fat or nonfat frozen yogurt. Try not to eat pastries, cakes, pies, and cookies.  Exercise Follow your exercise program as told by your doctor.  Be more active. Try gardening, walking, and taking the stairs.  Ask your doctor about ways that you can be more active.  Medicine  Take over-the-counter and prescription medicines only as told by your doctor. This information is not intended to replace advice given to you by your health care provider. Make sure you discuss any questions you have with your health care provider. Document Released: 11/17/2008 Document Revised: 03/22/2016 Document Reviewed: 03/02/2016 Elsevier Interactive Patient Education  [employer]2018 Elsevier Inc.

## 2017-10-18 ENCOUNTER — Telehealth: Payer: Self-pay

## 2017-10-18 NOTE — Telephone Encounter (Signed)
Patient was seen yesterday for FL2 completion.  Selene (patient's caregiver) called to request a change in level of care for patient does not qualify for assisted living   Selene and [facility]agle [facility] and was told he is 200 dollars over the income amount to qualify for assisted living nursing facility.   Please advise

## 2017-10-18 NOTE — Telephone Encounter (Signed)
[facility] Britt[facility]Barbara [facility] patient needs to be in skilled or memory care to qualify financially.   Britta MccreedyBarbara feels that patient is between skilled and assisted and does not know what to do.

## 2017-10-18 NOTE — Telephone Encounter (Signed)
Well what level of care is medicaid suggesting? He medically and physically qualifies for ALF BUT he may not financially qualify?

## 2017-10-19 ENCOUNTER — Ambulatory Visit (INDEPENDENT_AMBULATORY_CARE_PROVIDER_SITE_OTHER): Payer: Medicare Other | Admitting: Internal Medicine

## 2017-10-19 VITALS — BP 110/60 | HR 97 | Temp 97.5°F

## 2017-10-19 DIAGNOSIS — I779 Disorder of arteries and arterioles, unspecified: Secondary | ICD-10-CM

## 2017-10-19 DIAGNOSIS — Z111 Encounter for screening for respiratory tuberculosis: Secondary | ICD-10-CM

## 2017-10-19 LAB — TB SKIN TEST
Induration: 0 mm
TB Skin Test: NEGATIVE

## 2017-10-19 NOTE — Progress Notes (Signed)
PPD read by medical assistant - Neg  Bertis RuddyMonica S. Ancil Linseyarter, D. O., F. A. C. O. I.  [facility] [address] [place], [place] [id number] [phone] Cell (Monday-Friday 8 AM - 5 PM) [phone] After 5 PM and follow prompts

## 2017-10-21 NOTE — Telephone Encounter (Signed)
He really does not qualify for SNF and would be more appropriate for ALF. What about CAP/PCS?

## 2017-10-22 NOTE — Telephone Encounter (Signed)
Spoke with Britta MccreedyBarbara and she verbalized understanding. Britta MccreedyBarbara is looking into some low-income senior housing for patient. Patient is cognitive and takes cake of his own bills.   Britta MccreedyBarbara will collaborate with social services and contact us if needed

## 2017-10-29 ENCOUNTER — Telehealth: Payer: Self-pay | Admitting: *Deleted

## 2017-10-29 NOTE — Telephone Encounter (Signed)
Peter Castaneda, Friend called and stated that patient was having side effects from the new medication started, Atorvastatin. Stated that he is having diarrhea and feeling really lethargic and not motivated. Please Advise.

## 2017-10-29 NOTE — Telephone Encounter (Signed)
Reduce generic lipitor to 3 times per week due to diarrhea and weakness

## 2017-10-29 NOTE — Telephone Encounter (Signed)
Caregiver notified and agreed.  Medication list updated.

## 2017-11-02 ENCOUNTER — Encounter: Payer: Self-pay | Admitting: Cardiovascular Disease

## 2017-11-02 ENCOUNTER — Ambulatory Visit (INDEPENDENT_AMBULATORY_CARE_PROVIDER_SITE_OTHER): Payer: Medicare Other | Admitting: Cardiovascular Disease

## 2017-11-02 VITALS — BP 112/78 | HR 99 | Ht 67.0 in | Wt 129.4 lb

## 2017-11-02 DIAGNOSIS — I6523 Occlusion and stenosis of bilateral carotid arteries: Secondary | ICD-10-CM

## 2017-11-02 DIAGNOSIS — R0609 Other forms of dyspnea: Secondary | ICD-10-CM

## 2017-11-02 DIAGNOSIS — Z720 Tobacco use: Secondary | ICD-10-CM

## 2017-11-02 DIAGNOSIS — I779 Disorder of arteries and arterioles, unspecified: Secondary | ICD-10-CM | POA: Insufficient documentation

## 2017-11-02 DIAGNOSIS — I739 Peripheral vascular disease, unspecified: Secondary | ICD-10-CM

## 2017-11-02 NOTE — Assessment & Plan Note (Signed)
Mr. Peter Castaneda returns today for follow-up of his carotid Dopplers performed because of auscultated bruit. This revealed a high-grade left ICA stenosis which is asymptomatic from. I'm going to refer him back to Dr. Myra GianottiBrabham for consideration of endarterectomy. In the interim, I going to get a pharmacologic Myoview stress test to risk stratify him.

## 2017-11-02 NOTE — Assessment & Plan Note (Signed)
Discontinued one month ago

## 2017-11-02 NOTE — Patient Instructions (Signed)
Medication Instructions: Your physician recommends that you continue on your current medications as directed. Please refer to the Current Medication list given to you today.   Testing/Procedures: Your physician has requested that you have a lexiscan myoview. For further information please visit [email or web address]. Please follow instruction sheet, as given.  Follow-Up: You have been referred back to Dr. Myra GianottiBrabham for consideration for Left Carotid Endartarectomy.  Your physician wants you to follow-up in: 1 year with Dr. Allyson SabalBerry. You will receive a reminder letter in the mail two months in advance. If you don't receive a letter, please call our office to schedule the follow-up appointment.  If you need a refill on your cardiac medications before your next appointment, please call your pharmacy.

## 2017-11-02 NOTE — Progress Notes (Signed)
11/02/2017 Peter Castaneda   October 08, 1945  [id number]  Primary Physician Kirt Boysarter, Monica, DO Primary Cardiologist: Runell GessJonathan J Trypp Heckmann MD Milagros LollFACP, FACC, HoraceFAHA, [place]  HPI:  Peter Castaneda is a 72 y.o.  thin and chronically ill appearing single Caucsian male father of 2 children who is accompanied by his care taker Peter Castaneda. He was referred by Dr. Kirt BoysMonica Carter for cardiovascular evaluation. I last saw him in the office 10/03/17. He did have a murmur with a recent 2-D echo showed mild aortic stenosis and normal LV function. His risk factors for heart disease include 50 pack years of tobacco abuse, recently stopped a month ago. He's never had a heart attack or stroke. Denies chest pain but does have chronic dyspnea. He also has chronic low branch block. Recent grossly Dopplers revealed ABIs in the 0.4 0.5 range bilaterally. Does see Dr. Myra GianottiBrabham on 09/19/17 for peripheral vascular evaluation recommended conservative care I did obtain carotid Doppler studies because of auscultated bruit on 10/10/17 which revealed a high-grade left ICA stenosis. He is neurologically asymptomatic.  Current Meds  Medication Sig  . albuterol (PROVENTIL HFA;VENTOLIN HFA) 108 (90 Base) MCG/ACT inhaler Inhale 2 puffs into the lungs every 4 (four) hours as needed for wheezing or shortness of breath.  Marland Kitchen. aspirin 325 MG EC tablet Take 325 mg daily by mouth.  Marland Kitchen. atorvastatin (LIPITOR) 10 MG tablet Take 1 tablet (10 mg total) by mouth daily. For cholesterol (Patient taking differently: Take 10 mg by mouth 3 (three) times a week. For cholesterol)  . multivitamin-iron-minerals-folic acid (CENTRUM) chewable tablet Chew 1 tablet by mouth daily.     No Known Allergies  Social History   Socioeconomic History  . Marital status: Single    Spouse name: Not on file  . Number of children: Not on file  . Years of education: Not on file  . Highest education level: Not on file  Social Needs  . Financial resource strain: Not hard at all  . Food  insecurity - worry: Never true  . Food insecurity - inability: Never true  . Transportation needs - medical: No  . Transportation needs - non-medical: No  Occupational History  . Not on file  Tobacco Use  . Smoking status: Former Smoker    Packs/day: 1.00    Years: 50.00    Pack years: 50.00    Last attempt to quit: 09/10/2017    Years since quitting: 0.1  . Smokeless tobacco: Never Used  Substance and Sexual Activity  . Alcohol use: No    Frequency: Never  . Drug use: No  . Sexual activity: Not Currently    Partners: Female  Other Topics Concern  . Not on file  Social History Narrative   As of 07/24/17:      Diet: N/A      Caffeine: Coffee      Married, if yes what year: N/A      Do you live in a house, apartment, assisted living, condo, trailer, ect: 5th wheel trailer, 1 stories, 1 person       Pets: 1 dog      Current/Past profession: tool and Scientist, forensicdie maker, Art gallery managerengineer       Exercise: No         Living Will: No   DNR: No   POA/HPOA: No      Functional Status:   Do you have difficulty bathing or dressing yourself? Yes   Do you have difficulty preparing food or eating? Yes  Do you have difficulty managing your medications? No   Do you have difficulty managing your finances? Yes   Do you have difficulty affording your medications? No     Review of Systems: General: negative for chills, fever, night sweats or weight changes.  Cardiovascular: negative for chest pain, dyspnea on exertion, edema, orthopnea, palpitations, paroxysmal nocturnal dyspnea or shortness of breath Dermatological: negative for rash Respiratory: negative for cough or wheezing Urologic: negative for hematuria Abdominal: negative for nausea, vomiting, diarrhea, bright red blood per rectum, melena, or hematemesis Neurologic: negative for visual changes, syncope, or dizziness All other systems reviewed and are otherwise negative except as noted above.    Blood pressure 112/78, pulse 99, height  5\' 7"  (1.702 m), weight 129 lb 6.4 oz (58.7 kg).  General appearance: alert and no distress Neck: no adenopathy, no JVD, supple, symmetrical, trachea midline, thyroid not enlarged, symmetric, no tenderness/mass/nodules and Bilateral carotid bruits left lung with a right Lungs: clear to auscultation bilaterally Heart: Soft outflow tract murmur Extremities: extremities normal, atraumatic, no cyanosis or edema Pulses: Absent pedal pulses Skin: Skin color, texture, turgor normal. No rashes or lesions Neurologic: Alert and oriented X 3, normal strength and tone. Normal symmetric reflexes. Normal coordination and gait  EKG not performed today  ASSESSMENT AND PLAN:   Carotid artery disease [facility]) Mr. Laneve returns today for follow-up of his carotid Dopplers performed because of auscultated bruit. This revealed a high-grade left ICA stenosis which is asymptomatic from. I'm going to refer him back to Dr. Myra Gianotti for consideration of endarterectomy. In the interim, I going to get a pharmacologic Myoview stress test to risk stratify him.  Continuous tobacco abuse Discontinued one month ago      Runell Gess MD [facility], [facility] 11/02/2017 3:20 PM

## 2017-11-05 ENCOUNTER — Encounter: Payer: Self-pay | Admitting: Cardiovascular Disease

## 2017-11-05 ENCOUNTER — Other Ambulatory Visit: Payer: Self-pay

## 2017-11-05 DIAGNOSIS — I6522 Occlusion and stenosis of left carotid artery: Secondary | ICD-10-CM

## 2017-11-06 ENCOUNTER — Telehealth (HOSPITAL_COMMUNITY): Payer: Self-pay

## 2017-11-06 NOTE — Telephone Encounter (Signed)
Encounter complete. 

## 2017-11-07 ENCOUNTER — Ambulatory Visit (HOSPITAL_COMMUNITY)
Admission: RE | Admit: 2017-11-07 | Discharge: 2017-11-07 | Disposition: A | Discharge status: Home / Self Care | Payer: Medicare Other | Source: Ambulatory Visit | Attending: Cardiovascular Disease | Admitting: Cardiovascular Disease

## 2017-11-07 ENCOUNTER — Encounter: Payer: Self-pay | Admitting: Cardiovascular Disease

## 2017-11-07 DIAGNOSIS — R0609 Other forms of dyspnea: Principal | ICD-10-CM

## 2017-11-07 DIAGNOSIS — Z720 Tobacco use: Secondary | ICD-10-CM

## 2017-11-07 DIAGNOSIS — I6523 Occlusion and stenosis of bilateral carotid arteries: Secondary | ICD-10-CM

## 2017-11-09 ENCOUNTER — Telehealth (HOSPITAL_COMMUNITY): Payer: Self-pay

## 2017-11-09 NOTE — Telephone Encounter (Signed)
Encounter complete. 

## 2017-11-14 ENCOUNTER — Ambulatory Visit (HOSPITAL_COMMUNITY)
Admission: RE | Admit: 2017-11-14 | Discharge: 2017-11-14 | Disposition: A | Discharge status: Home / Self Care | Payer: Medicare Other | Source: Ambulatory Visit | Attending: Cardiovascular Disease | Admitting: Cardiovascular Disease

## 2017-11-14 ENCOUNTER — Other Ambulatory Visit: Payer: Medicare Other

## 2017-11-14 DIAGNOSIS — J449 Chronic obstructive pulmonary disease, unspecified: Secondary | ICD-10-CM | POA: Insufficient documentation

## 2017-11-14 DIAGNOSIS — I519 Heart disease, unspecified: Secondary | ICD-10-CM | POA: Insufficient documentation

## 2017-11-14 DIAGNOSIS — Z720 Tobacco use: Secondary | ICD-10-CM

## 2017-11-14 DIAGNOSIS — Z79899 Other long term (current) drug therapy: Secondary | ICD-10-CM | POA: Diagnosis not present

## 2017-11-14 DIAGNOSIS — I6523 Occlusion and stenosis of bilateral carotid arteries: Secondary | ICD-10-CM | POA: Insufficient documentation

## 2017-11-14 DIAGNOSIS — R0609 Other forms of dyspnea: Secondary | ICD-10-CM

## 2017-11-14 DIAGNOSIS — I4510 Unspecified right bundle-branch block: Secondary | ICD-10-CM | POA: Insufficient documentation

## 2017-11-14 DIAGNOSIS — E782 Mixed hyperlipidemia: Secondary | ICD-10-CM

## 2017-11-14 LAB — MYOCARDIAL PERFUSION IMAGING
CHL CUP NUCLEAR SSS: 1
CHL CUP RESTING HR STRESS: 91 {beats}/min
CSEPPHR: 109 {beats}/min
LV sys vol: 20 mL
LVDIAVOL: 57 mL (ref 62–150)
SDS: 0
SRS: 1
TID: 1

## 2017-11-14 LAB — LIPID PANEL
Cholesterol: 182 mg/dL (ref <200)
HDL: 54 mg/dL (ref >40)
LDL CHOLESTEROL (CALC): 106 mg/dL — AB
Non-HDL Cholesterol (Calc): 128 mg/dL (calc) (ref <130)
TRIGLYCERIDES: 123 mg/dL (ref <150)
Total CHOL/HDL Ratio: 3.4 (calc) (ref <5.0)

## 2017-11-14 LAB — ALT: ALT: 20 U/L (ref 9–46)

## 2017-11-14 MED ORDER — TECHNETIUM TC 99M TETROFOSMIN IV KIT
10.4000 | PACK | Freq: Once | INTRAVENOUS | Status: AC | PRN
  Administered 2017-11-14: 10.4 via INTRAVENOUS
  Filled 2017-11-14: qty 11

## 2017-11-14 MED ORDER — TECHNETIUM TC 99M TETROFOSMIN IV KIT
33.0000 | PACK | Freq: Once | INTRAVENOUS | Status: AC | PRN
Start: 2017-11-14 — End: 2017-11-14
  Administered 2017-11-14: 33 via INTRAVENOUS
  Filled 2017-11-14: qty 33

## 2017-11-14 MED ORDER — REGADENOSON 0.4 MG/5ML IV SOLN
0.4000 mg | Freq: Once | INTRAVENOUS | Status: AC
  Administered 2017-11-14: 0.4 mg via INTRAVENOUS

## 2017-11-15 ENCOUNTER — Other Ambulatory Visit: Payer: Medicare Other

## 2017-12-10 ENCOUNTER — Other Ambulatory Visit: Payer: Self-pay | Admitting: *Deleted

## 2017-12-10 ENCOUNTER — Encounter: Payer: Self-pay | Admitting: Surgery

## 2017-12-10 ENCOUNTER — Encounter: Payer: Self-pay | Admitting: *Deleted

## 2017-12-10 ENCOUNTER — Ambulatory Visit (HOSPITAL_COMMUNITY)
Admission: RE | Admit: 2017-12-10 | Discharge: 2017-12-10 | Disposition: A | Discharge status: Home / Self Care | Payer: Medicare Other | Source: Ambulatory Visit | Attending: Surgery | Admitting: Surgery

## 2017-12-10 ENCOUNTER — Ambulatory Visit (INDEPENDENT_AMBULATORY_CARE_PROVIDER_SITE_OTHER): Payer: Medicare Other | Admitting: Surgery

## 2017-12-10 ENCOUNTER — Other Ambulatory Visit: Payer: Self-pay

## 2017-12-10 VITALS — BP 128/86 | HR 85 | Resp 20 | Ht 67.0 in | Wt 132.6 lb

## 2017-12-10 DIAGNOSIS — I70213 Atherosclerosis of native arteries of extremities with intermittent claudication, bilateral legs: Secondary | ICD-10-CM | POA: Diagnosis not present

## 2017-12-10 DIAGNOSIS — I6523 Occlusion and stenosis of bilateral carotid arteries: Secondary | ICD-10-CM

## 2017-12-10 DIAGNOSIS — I6522 Occlusion and stenosis of left carotid artery: Secondary | ICD-10-CM | POA: Diagnosis not present

## 2017-12-10 DIAGNOSIS — I779 Disorder of arteries and arterioles, unspecified: Secondary | ICD-10-CM

## 2017-12-10 NOTE — Progress Notes (Signed)
Vascular and Vein Specialist of [facility]  Patient name: Peter Castaneda MRN: [id number] DOB: December 16, 1945 Sex: male   REASON FOR VISIT:    Carotid stenosis  HISOTRY OF PRESENT ILLNESS:    Peter Castaneda is a 72 y.o. male, who I met back in January 2019 for claudication in his left hip.  We elected to treat him medically as his walking was more limited by his shortness of breath.  He has been a chronic smoker greater than 50 years but has not had a cigarette since our visit.  During his visit I heard a left carotid bruit.  We talked about getting an ultrasound however there were some financial issues and so we decided to wait until his follow-up to get his duplex.  In the interim he had an ultrasound at heart care which showed greater than 80% left carotid stenosis.  He is asymptomatic.  He denies numbness or weakness in either extremity.  He denies slurred speech.  He denies amaurosis fugax.  Patient takes an aspirin.  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  He is not taking his inhalers currently.  He is on aspirin.  He recently had a low risk Myoview.    PAST MEDICAL HISTORY:   Past Medical History:  Diagnosis Date  . Carotid artery occlusion   . COPD (chronic obstructive pulmonary disease) ([place])   . Low blood pressure    Entered from new patient packet      FAMILY HISTORY:   Family History  Problem Relation Age of Onset  . Heart disease Mother   . Cancer Mother        Unknown type  . Alzheimer's disease Father   . Rectal cancer Neg Hx   . Colon cancer Neg Hx   . Stomach cancer Neg Hx     SOCIAL HISTORY:   Social History   Tobacco Use  . Smoking status: Former Smoker    Packs/day: 1.00    Years: 50.00    Pack years: 50.00    Last attempt to quit: 09/10/2017    Years since quitting: 0.2  . Smokeless tobacco: Never Used  Substance Use Topics  . Alcohol use: No    Frequency: Never     ALLERGIES:   No Known  Allergies   CURRENT MEDICATIONS:   Current Outpatient Medications  Medication Sig Dispense Refill  . albuterol (PROVENTIL HFA;VENTOLIN HFA) 108 (90 Base) MCG/ACT inhaler Inhale 2 puffs into the lungs every 4 (four) hours as needed for wheezing or shortness of breath. 1 Inhaler 6  . aspirin 325 MG EC tablet Take 325 mg daily by mouth.    Marland Kitchen atorvastatin (LIPITOR) 10 MG tablet Take 1 tablet (10 mg total) by mouth daily. For cholesterol (Patient taking differently: Take 10 mg by mouth 3 (three) times a week. For cholesterol) 30 tablet 6  . multivitamin-iron-minerals-folic acid (CENTRUM) chewable tablet Chew 1 tablet by mouth daily.     No current facility-administered medications for this visit.     REVIEW OF SYSTEMS:   _0  denotes positive finding, _1  denotes negative finding Cardiac  Comments:  Chest pain or chest pressure:    Shortness of breath upon exertion: x   Short of breath when lying flat:    Irregular heart rhythm:        Vascular    Pain in calf, thigh, or hip brought on by ambulation: x   Pain in feet at night that wakes you up from your sleep:  Blood clot in your veins:    Leg swelling:         Pulmonary    Oxygen at home:    Productive cough:     Wheezing:         Neurologic    Sudden weakness in arms or legs:     Sudden numbness in arms or legs:     Sudden onset of difficulty speaking or slurred speech:    Temporary loss of vision in one eye:     Problems with dizziness:         Gastrointestinal    Blood in stool:     Vomited blood:         Genitourinary    Burning when urinating:     Blood in urine:        Psychiatric    Major depression:         Hematologic    Bleeding problems:    Problems with blood clotting too easily:        Skin    Rashes or ulcers:        Constitutional    Fever or chills:      PHYSICAL EXAM:   Vitals:   12/10/17 0925 12/10/17 0927  BP: (!) 151/85 128/86  Pulse: 85   Resp: 20   SpO2: 98%   Weight: 132 lb  9.6 oz (60.1 kg)   Height: _0  (1.702 m)     GENERAL: The patient is a well-nourished male, in no acute distress. The vital signs are documented above. CARDIAC: There is a regular rate and rhythm.  VASCULAR: Left carotid bruit PULMONARY: Non-labored respirations.  MUSCULOSKELETAL: There are no major deformities or cyanosis. NEUROLOGIC: No focal weakness or paresthesias are detected. SKIN: There are no ulcers or rashes noted. PSYCHIATRIC: The patient has a normal affect.  STUDIES:   I have reviewed his carotid duplex.  This shows greater than 80% left carotid stenosis.  The bifurcation is mid hyoid.  There is 40-59% right carotid stenosis  MEDICAL ISSUES:   Asymptomatic left carotid stenosis: We discussed proceeding with left carotid endarterectomy.  I discussed the risks and benefits of the operation including the risk of stroke, nerve injury, and cardiopulmonary complications.  All of his questions were answered.  His operation been scheduled for Friday, May 3.    Annamarie Major, MD Vascular and Vein Specialists of [facility] [phone] Pager [phone]

## 2017-12-10 NOTE — H&P (View-Only) (Signed)
Vascular and Vein Specialist of [facility]  Patient name: Peter Castaneda MRN: [id number] DOB: December 16, 1945 Sex: male   REASON FOR VISIT:    Carotid stenosis  HISOTRY OF PRESENT ILLNESS:    Peter Castaneda is a 72 y.o. male, who I met back in January 2019 for claudication in his left hip.  We elected to treat him medically as his walking was more limited by his shortness of breath.  He has been a chronic smoker greater than 50 years but has not had a cigarette since our visit.  During his visit I heard a left carotid bruit.  We talked about getting an ultrasound however there were some financial issues and so we decided to wait until his follow-up to get his duplex.  In the interim he had an ultrasound at heart care which showed greater than 80% left carotid stenosis.  He is asymptomatic.  He denies numbness or weakness in either extremity.  He denies slurred speech.  He denies amaurosis fugax.  Patient takes an aspirin.  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  He is not taking his inhalers currently.  He is on aspirin.  He recently had a low risk Myoview.    PAST MEDICAL HISTORY:   Past Medical History:  Diagnosis Date  . Carotid artery occlusion   . COPD (chronic obstructive pulmonary disease) ([place])   . Low blood pressure    Entered from new patient packet      FAMILY HISTORY:   Family History  Problem Relation Age of Onset  . Heart disease Mother   . Cancer Mother        Unknown type  . Alzheimer's disease Father   . Rectal cancer Neg Hx   . Colon cancer Neg Hx   . Stomach cancer Neg Hx     SOCIAL HISTORY:   Social History   Tobacco Use  . Smoking status: Former Smoker    Packs/day: 1.00    Years: 50.00    Pack years: 50.00    Last attempt to quit: 09/10/2017    Years since quitting: 0.2  . Smokeless tobacco: Never Used  Substance Use Topics  . Alcohol use: No    Frequency: Never     ALLERGIES:   No Known  Allergies   CURRENT MEDICATIONS:   Current Outpatient Medications  Medication Sig Dispense Refill  . albuterol (PROVENTIL HFA;VENTOLIN HFA) 108 (90 Base) MCG/ACT inhaler Inhale 2 puffs into the lungs every 4 (four) hours as needed for wheezing or shortness of breath. 1 Inhaler 6  . aspirin 325 MG EC tablet Take 325 mg daily by mouth.    Marland Kitchen atorvastatin (LIPITOR) 10 MG tablet Take 1 tablet (10 mg total) by mouth daily. For cholesterol (Patient taking differently: Take 10 mg by mouth 3 (three) times a week. For cholesterol) 30 tablet 6  . multivitamin-iron-minerals-folic acid (CENTRUM) chewable tablet Chew 1 tablet by mouth daily.     No current facility-administered medications for this visit.     REVIEW OF SYSTEMS:   _0  denotes positive finding, _1  denotes negative finding Cardiac  Comments:  Chest pain or chest pressure:    Shortness of breath upon exertion: x   Short of breath when lying flat:    Irregular heart rhythm:        Vascular    Pain in calf, thigh, or hip brought on by ambulation: x   Pain in feet at night that wakes you up from your sleep:  Blood clot in your veins:    Leg swelling:         Pulmonary    Oxygen at home:    Productive cough:     Wheezing:         Neurologic    Sudden weakness in arms or legs:     Sudden numbness in arms or legs:     Sudden onset of difficulty speaking or slurred speech:    Temporary loss of vision in one eye:     Problems with dizziness:         Gastrointestinal    Blood in stool:     Vomited blood:         Genitourinary    Burning when urinating:     Blood in urine:        Psychiatric    Major depression:         Hematologic    Bleeding problems:    Problems with blood clotting too easily:        Skin    Rashes or ulcers:        Constitutional    Fever or chills:      PHYSICAL EXAM:   Vitals:   12/10/17 0925 12/10/17 0927  BP: (!) 151/85 128/86  Pulse: 85   Resp: 20   SpO2: 98%   Weight: 132 lb  9.6 oz (60.1 kg)   Height: _0  (1.702 m)     GENERAL: The patient is a well-nourished male, in no acute distress. The vital signs are documented above. CARDIAC: There is a regular rate and rhythm.  VASCULAR: Left carotid bruit PULMONARY: Non-labored respirations.  MUSCULOSKELETAL: There are no major deformities or cyanosis. NEUROLOGIC: No focal weakness or paresthesias are detected. SKIN: There are no ulcers or rashes noted. PSYCHIATRIC: The patient has a normal affect.  STUDIES:   I have reviewed his carotid duplex.  This shows greater than 80% left carotid stenosis.  The bifurcation is mid hyoid.  There is 40-59% right carotid stenosis  MEDICAL ISSUES:   Asymptomatic left carotid stenosis: We discussed proceeding with left carotid endarterectomy.  I discussed the risks and benefits of the operation including the risk of stroke, nerve injury, and cardiopulmonary complications.  All of his questions were answered.  His operation been scheduled for Friday, May 3.    Annamarie Major, MD [facility] [phone] Pager [phone]

## 2017-12-21 NOTE — Pre-Procedure Instructions (Signed)
Peter Castaneda  12/21/2017      [facility] [place] [address] [place] [address] Phone: [phone] Fax: [phone]    Your procedure is scheduled on  Friday 01/04/18  [facility] at 720 A.M.  Call this number if you have problems the morning of surgery:  [phone]   Remember:  Do not eat food or drink liquids after midnight.  Take these medicines the morning of surgery with A SIP OF WATER - ALBUTEROL IF NEEDED (BRING TO HOSPITAL)  7 days prior to surgery STOP taking any  Aleve, Naproxen, Ibuprofen, Motrin, Advil, Goody's, BC's, all herbal medications, fish oil, and all vitamins   Do not wear jewelry, make-up or nail polish.  Do not wear lotions, powders, or perfumes, or deodorant.  Do not shave 48 hours prior to surgery.  Men may shave face and neck.  Do not bring valuables to the hospital.  [facility] is not responsible for any belongings or valuables.  Contacts, dentures or bridgework may not be worn into surgery.  Leave your suitcase in the car.  After surgery it may be brought to your room.  For patients admitted to the hospital, discharge time will be determined by your treatment team.  Patients discharged the day of surgery will not be allowed to drive home.   Name and phone number of your driver:    Special instructions:  [place] - Preparing for Surgery  Before surgery, you can play an important role.  Because skin is not sterile, your skin needs to be as free of germs as possible.  You can reduce the number of germs on you skin by washing with CHG (chlorahexidine gluconate) soap before surgery.  CHG is an antiseptic cleaner which kills germs and bonds with the skin to continue killing germs even after washing.  Please DO NOT use if you have an allergy to CHG or antibacterial soaps.  If your skin becomes reddened/irritated stop using the CHG and inform  your nurse when you arrive at Short Stay.  Do not shave (including legs and underarms) for at least 48 hours prior to the first CHG shower.  You may shave your face.  Please follow these instructions carefully:   1.  Shower with CHG Soap the night before surgery and the                                morning of Surgery.  2.  If you choose to wash your hair, wash your hair first as usual with your       normal shampoo.  3.  After you shampoo, rinse your hair and body thoroughly to remove the                      Shampoo.  4.  Use CHG as you would any other liquid soap.  You can apply chg directly       to the skin and wash gently with scrungie or a clean washcloth.  5.  Apply the CHG Soap to your body ONLY FROM THE NECK DOWN.        Do not use on open wounds or open sores.  Avoid contact with your eyes,       ears, mouth and genitals (private parts).  Wash genitals (private parts)  with your normal soap.  6.  Wash thoroughly, paying special attention to the area where your surgery        will be performed.  7.  Thoroughly rinse your body with warm water from the neck down.  8.  DO NOT shower/wash with your normal soap after using and rinsing off       the CHG Soap.  9.  Pat yourself dry with a clean towel.            10.  Wear clean pajamas.            11.  Place clean sheets on your bed the night of your first shower and do not        sleep with pets.  Day of Surgery  Do not apply any lotions/deoderants the morning of surgery.  Please wear clean clothes to the hospital/surgery center.     Please read over the following fact sheets that you were given. MRSA Information and Surgical Site Infection Prevention

## 2017-12-21 NOTE — Pre-Procedure Instructions (Signed)
Peter Castaneda  12/21/2017      [facility] [place] - [address] [place] [address] Phone: [phone] Fax: [phone]    Your procedure is scheduled on  Friday 01/04/18  [facility] at 720 A.M.  Call this number if you have problems the morning of surgery:  [phone]   Remember:  Do not eat food or drink liquids after midnight.  Take these medicines the morning of surgery with A SIP OF WATER - ALBUTEROL IF NEEDED (BRING TO HOSPITAL)   Do not wear jewelry, make-up or nail polish.  Do not wear lotions, powders, or perfumes, or deodorant.  Do not shave 48 hours prior to surgery.  Men may shave face and neck.  Do not bring valuables to the hospital.  [facility] is not responsible for any belongings or valuables.  Contacts, dentures or bridgework may not be worn into surgery.  Leave your suitcase in the car.  After surgery it may be brought to your room.  For patients admitted to the hospital, discharge time will be determined by your treatment team.  Patients discharged the day of surgery will not be allowed to drive home.   Name and phone number of your driver:    Special instructions:  [place] - Preparing for Surgery  Before surgery, you can play an important role.  Because skin is not sterile, your skin needs to be as free of germs as possible.  You can reduce the number of germs on you skin by washing with CHG (chlorahexidine gluconate) soap before surgery.  CHG is an antiseptic cleaner which kills germs and bonds with the skin to continue killing germs even after washing.  Please DO NOT use if you have an allergy to CHG or antibacterial soaps.  If your skin becomes reddened/irritated stop using the CHG and inform your nurse when you arrive at Short Stay.  Do not shave (including legs and underarms) for at least 48 hours prior to the first CHG shower.  You may shave  your face.  Please follow these instructions carefully:   1.  Shower with CHG Soap the night before surgery and the                                morning of Surgery.  2.  If you choose to wash your hair, wash your hair first as usual with your       normal shampoo.  3.  After you shampoo, rinse your hair and body thoroughly to remove the                      Shampoo.  4.  Use CHG as you would any other liquid soap.  You can apply chg directly       to the skin and wash gently with scrungie or a clean washcloth.  5.  Apply the CHG Soap to your body ONLY FROM THE NECK DOWN.        Do not use on open wounds or open sores.  Avoid contact with your eyes,       ears, mouth and genitals (private parts).  Wash genitals (private parts)       with your normal soap.  6.  Wash thoroughly, paying special attention to the area where your surgery  will be performed.  7.  Thoroughly rinse your body with warm water from the neck down.  8.  DO NOT shower/wash with your normal soap after using and rinsing off       the CHG Soap.  9.  Pat yourself dry with a clean towel.            10.  Wear clean pajamas.            11.  Place clean sheets on your bed the night of your first shower and do not        sleep with pets.  Day of Surgery  Do not apply any lotions/deoderants the morning of surgery.  Please wear clean clothes to the hospital/surgery center.     Please read over the following fact sheets that you were given. MRSA Information and Surgical Site Infection Prevention

## 2017-12-24 ENCOUNTER — Encounter (HOSPITAL_COMMUNITY)
Admission: RE | Admit: 2017-12-24 | Discharge: 2017-12-24 | Disposition: A | Discharge status: Home / Self Care | Payer: Medicare Other | Source: Ambulatory Visit | Attending: Surgery | Admitting: Surgery

## 2017-12-24 ENCOUNTER — Other Ambulatory Visit: Payer: Self-pay

## 2017-12-24 ENCOUNTER — Encounter (HOSPITAL_COMMUNITY): Payer: Self-pay

## 2017-12-24 DIAGNOSIS — Z01812 Encounter for preprocedural laboratory examination: Secondary | ICD-10-CM | POA: Diagnosis not present

## 2017-12-24 HISTORY — DX: Unspecified osteoarthritis, unspecified site: M19.90

## 2017-12-24 HISTORY — DX: Cardiac murmur, unspecified: R01.1

## 2017-12-24 HISTORY — DX: Dyspnea, unspecified: R06.00

## 2017-12-24 HISTORY — DX: Anxiety disorder, unspecified: F41.9

## 2017-12-24 LAB — TYPE AND SCREEN
ABO/RH(D): B POS
Antibody Screen: NEGATIVE

## 2017-12-24 LAB — COMPREHENSIVE METABOLIC PANEL
ALBUMIN: 4 g/dL (ref 3.5–5.0)
ALK PHOS: 78 U/L (ref 38–126)
ALT: 19 U/L (ref 17–63)
ANION GAP: 9 (ref 5–15)
AST: 18 U/L (ref 15–41)
BUN: 17 mg/dL (ref 6–20)
CALCIUM: 9.5 mg/dL (ref 8.9–10.3)
CHLORIDE: 103 mmol/L (ref 101–111)
CO2: 23 mmol/L (ref 22–32)
Creatinine, Ser: 1.01 mg/dL (ref 0.61–1.24)
GFR calc non Af Amer: >60 mL/min (ref >60)
GLUCOSE: 97 mg/dL (ref 65–99)
Potassium: 4 mmol/L (ref 3.5–5.1)
SODIUM: 135 mmol/L (ref 135–145)
Total Bilirubin: 0.5 mg/dL (ref 0.3–1.2)
Total Protein: 7.5 g/dL (ref 6.5–8.1)

## 2017-12-24 LAB — URINALYSIS, ROUTINE W REFLEX MICROSCOPIC
Bacteria, UA: NONE SEEN
Bilirubin Urine: NEGATIVE
GLUCOSE, UA: NEGATIVE mg/dL
Ketones, ur: NEGATIVE mg/dL
Leukocytes, UA: NEGATIVE
Nitrite: NEGATIVE
PROTEIN: NEGATIVE mg/dL
SQUAMOUS EPITHELIAL / LPF: NONE SEEN
Specific Gravity, Urine: 1.008 (ref 1.005–1.030)
pH: 5 (ref 5.0–8.0)

## 2017-12-24 LAB — CBC
HCT: 40.9 % (ref 39.0–52.0)
HEMOGLOBIN: 14 g/dL (ref 13.0–17.0)
MCH: 29.2 pg (ref 26.0–34.0)
MCHC: 34.2 g/dL (ref 30.0–36.0)
MCV: 85.2 fL (ref 78.0–100.0)
PLATELETS: 216 10*3/uL (ref 150–400)
RBC: 4.8 MIL/uL (ref 4.22–5.81)
RDW: 13.1 % (ref 11.5–15.5)
WBC: 6.4 10*3/uL (ref 4.0–10.5)

## 2017-12-24 LAB — SURGICAL PCR SCREEN
MRSA, PCR: NEGATIVE
STAPHYLOCOCCUS AUREUS: NEGATIVE

## 2017-12-24 LAB — PROTIME-INR
INR: 1
Prothrombin Time: 13.1 seconds (ref 11.4–15.2)

## 2017-12-24 LAB — ABO/RH: ABO/RH(D): B POS

## 2017-12-24 LAB — APTT: APTT: 41 s — AB (ref 24–36)

## 2017-12-24 NOTE — Progress Notes (Signed)
Dr. Nanetta BattyJonathan Berry  Stress test  11-14-2017  Echo 09-17-2017  Until problems occurred with legs pt. Has not seen a doctor in over 20 years.  Continue ASA per Dr. Myra GianottiBrabham

## 2018-01-04 ENCOUNTER — Other Ambulatory Visit: Payer: Self-pay

## 2018-01-04 ENCOUNTER — Telehealth: Payer: Self-pay | Admitting: Surgery

## 2018-01-04 ENCOUNTER — Inpatient Hospital Stay (HOSPITAL_COMMUNITY)
Admission: RE | Admit: 2018-01-04 | Discharge: 2018-01-05 | DRG: 039 | Disposition: A | Discharge status: Home / Self Care | Payer: Medicare Other | Source: Ambulatory Visit | Attending: Surgery | Admitting: Surgery

## 2018-01-04 ENCOUNTER — Inpatient Hospital Stay (HOSPITAL_COMMUNITY): Payer: Medicare Other | Admitting: Certified Registered Nurse Anesthetist

## 2018-01-04 ENCOUNTER — Encounter (HOSPITAL_COMMUNITY)
Admission: RE | Disposition: A | Discharge status: Home / Self Care | Payer: Self-pay | Source: Ambulatory Visit | Attending: Surgery

## 2018-01-04 ENCOUNTER — Encounter (HOSPITAL_COMMUNITY): Payer: Self-pay | Admitting: *Deleted

## 2018-01-04 DIAGNOSIS — R29810 Facial weakness: Secondary | ICD-10-CM | POA: Diagnosis not present

## 2018-01-04 DIAGNOSIS — Z7982 Long term (current) use of aspirin: Secondary | ICD-10-CM | POA: Diagnosis not present

## 2018-01-04 DIAGNOSIS — I6529 Occlusion and stenosis of unspecified carotid artery: Secondary | ICD-10-CM | POA: Diagnosis present

## 2018-01-04 DIAGNOSIS — J449 Chronic obstructive pulmonary disease, unspecified: Secondary | ICD-10-CM | POA: Diagnosis present

## 2018-01-04 DIAGNOSIS — I6522 Occlusion and stenosis of left carotid artery: Secondary | ICD-10-CM | POA: Diagnosis not present

## 2018-01-04 DIAGNOSIS — Z87891 Personal history of nicotine dependence: Secondary | ICD-10-CM

## 2018-01-04 DIAGNOSIS — Z7951 Long term (current) use of inhaled steroids: Secondary | ICD-10-CM

## 2018-01-04 DIAGNOSIS — Z79899 Other long term (current) drug therapy: Secondary | ICD-10-CM | POA: Diagnosis not present

## 2018-01-04 HISTORY — PX: PATCH ANGIOPLASTY: SHX6230

## 2018-01-04 HISTORY — PX: ENDARTERECTOMY: SHX5162

## 2018-01-04 LAB — GLUCOSE, CAPILLARY
GLUCOSE-CAPILLARY: 196 mg/dL — AB (ref 65–99)
Glucose-Capillary: 134 mg/dL — ABNORMAL HIGH (ref 65–99)

## 2018-01-04 SURGERY — ENDARTERECTOMY, CAROTID
Anesthesia: General | Site: Neck | Laterality: Left

## 2018-01-04 MED ORDER — SODIUM CHLORIDE 0.9 % IV SOLN
INTRAVENOUS | Status: DC
  Administered 2018-01-04: 19:00:00 via INTRAVENOUS

## 2018-01-04 MED ORDER — MORPHINE SULFATE (PF) 2 MG/ML IV SOLN: 0.5000 mg | INTRAVENOUS | Status: DC | PRN

## 2018-01-04 MED ORDER — PROPOFOL 10 MG/ML IV BOLUS
INTRAVENOUS | Status: DC | PRN
  Administered 2018-01-04: 130 mg via INTRAVENOUS
  Administered 2018-01-04: 30 mg via INTRAVENOUS

## 2018-01-04 MED ORDER — ONDANSETRON HCL 4 MG/2ML IJ SOLN: 4.0000 mg | Freq: Four times a day (QID) | INTRAMUSCULAR | Status: DC | PRN

## 2018-01-04 MED ORDER — CEFAZOLIN SODIUM-DEXTROSE 2-4 GM/100ML-% IV SOLN
INTRAVENOUS | Status: AC
  Filled 2018-01-04: qty 100

## 2018-01-04 MED ORDER — OXYCODONE HCL 5 MG PO TABS: 5.0000 mg | ORAL_TABLET | Freq: Once | ORAL | Status: DC | PRN

## 2018-01-04 MED ORDER — FENTANYL CITRATE (PF) 100 MCG/2ML IJ SOLN
25.0000 ug | INTRAMUSCULAR | Status: DC | PRN
  Administered 2018-01-04 (×2): 50 ug via INTRAVENOUS

## 2018-01-04 MED ORDER — OXYCODONE HCL 5 MG/5ML PO SOLN: 5.0000 mg | Freq: Once | ORAL | Status: DC | PRN

## 2018-01-04 MED ORDER — SODIUM CHLORIDE 0.9 % IV SOLN: INTRAVENOUS | Status: DC

## 2018-01-04 MED ORDER — DOCUSATE SODIUM 100 MG PO CAPS
100.0000 mg | ORAL_CAPSULE | Freq: Every day | ORAL | Status: DC
  Administered 2018-01-05: 100 mg via ORAL
  Filled 2018-01-04: qty 1

## 2018-01-04 MED ORDER — HEPARIN SODIUM (PORCINE) 5000 UNIT/ML IJ SOLN
INTRAMUSCULAR | Status: AC
  Filled 2018-01-04: qty 1.2

## 2018-01-04 MED ORDER — OXYCODONE-ACETAMINOPHEN 5-325 MG PO TABS: 1.0000 | ORAL_TABLET | Freq: Four times a day (QID) | ORAL | 0 refills | Status: AC | PRN

## 2018-01-04 MED ORDER — ROCURONIUM BROMIDE 100 MG/10ML IV SOLN
INTRAVENOUS | Status: DC | PRN
  Administered 2018-01-04: 20 mg via INTRAVENOUS
  Administered 2018-01-04: 10 mg via INTRAVENOUS
  Administered 2018-01-04: 50 mg via INTRAVENOUS

## 2018-01-04 MED ORDER — GUAIFENESIN-DM 100-10 MG/5ML PO SYRP: 15.0000 mL | ORAL_SOLUTION | ORAL | Status: DC | PRN

## 2018-01-04 MED ORDER — ALUM & MAG HYDROXIDE-SIMETH 200-200-20 MG/5ML PO SUSP: 15.0000 mL | ORAL | Status: DC | PRN

## 2018-01-04 MED ORDER — HYDRALAZINE HCL 20 MG/ML IJ SOLN: 5.0000 mg | INTRAMUSCULAR | Status: DC | PRN

## 2018-01-04 MED ORDER — FENTANYL CITRATE (PF) 100 MCG/2ML IJ SOLN
INTRAMUSCULAR | Status: DC | PRN
  Administered 2018-01-04: 25 ug via INTRAVENOUS
  Administered 2018-01-04: 100 ug via INTRAVENOUS
  Administered 2018-01-04 (×2): 50 ug via INTRAVENOUS
  Administered 2018-01-04: 25 ug via INTRAVENOUS
  Administered 2018-01-04: 50 ug via INTRAVENOUS

## 2018-01-04 MED ORDER — PHENOL 1.4 % MT LIQD: 1.0000 | OROMUCOSAL | Status: DC | PRN

## 2018-01-04 MED ORDER — ACETAMINOPHEN 325 MG PO TABS: 325.0000 mg | ORAL_TABLET | ORAL | Status: DC | PRN

## 2018-01-04 MED ORDER — LIDOCAINE HCL (PF) 1 % IJ SOLN
INTRAMUSCULAR | Status: AC
  Filled 2018-01-04: qty 30

## 2018-01-04 MED ORDER — PROTAMINE SULFATE 10 MG/ML IV SOLN
INTRAVENOUS | Status: DC | PRN
  Administered 2018-01-04 (×2): 20 mg via INTRAVENOUS
  Administered 2018-01-04: 10 mg via INTRAVENOUS

## 2018-01-04 MED ORDER — MIDAZOLAM HCL 5 MG/5ML IJ SOLN
INTRAMUSCULAR | Status: DC | PRN
  Administered 2018-01-04: 2 mg via INTRAVENOUS

## 2018-01-04 MED ORDER — FENTANYL CITRATE (PF) 100 MCG/2ML IJ SOLN
INTRAMUSCULAR | Status: AC
  Administered 2018-01-04: 50 ug via INTRAVENOUS
  Filled 2018-01-04: qty 2

## 2018-01-04 MED ORDER — SENNOSIDES-DOCUSATE SODIUM 8.6-50 MG PO TABS: 1.0000 | ORAL_TABLET | Freq: Every evening | ORAL | Status: DC | PRN

## 2018-01-04 MED ORDER — OXYCODONE HCL 5 MG PO TABS: 5.0000 mg | ORAL_TABLET | ORAL | Status: DC | PRN

## 2018-01-04 MED ORDER — BISACODYL 5 MG PO TBEC: 5.0000 mg | DELAYED_RELEASE_TABLET | Freq: Every day | ORAL | Status: DC | PRN

## 2018-01-04 MED ORDER — POTASSIUM CHLORIDE CRYS ER 20 MEQ PO TBCR: 20.0000 meq | EXTENDED_RELEASE_TABLET | Freq: Every day | ORAL | Status: DC | PRN

## 2018-01-04 MED ORDER — FENTANYL CITRATE (PF) 250 MCG/5ML IJ SOLN
INTRAMUSCULAR | Status: AC
Start: 2018-01-04 — End: ?
  Filled 2018-01-04: qty 10

## 2018-01-04 MED ORDER — CEFAZOLIN SODIUM-DEXTROSE 2-4 GM/100ML-% IV SOLN
2.0000 g | Freq: Three times a day (TID) | INTRAVENOUS | Status: AC
  Administered 2018-01-04 – 2018-01-05 (×2): 2 g via INTRAVENOUS
  Filled 2018-01-04 (×2): qty 100

## 2018-01-04 MED ORDER — SODIUM CHLORIDE 0.9 % IV SOLN: 500.0000 mL | Freq: Once | INTRAVENOUS | Status: DC | PRN

## 2018-01-04 MED ORDER — SUGAMMADEX SODIUM 200 MG/2ML IV SOLN
INTRAVENOUS | Status: DC | PRN
  Administered 2018-01-04: 300 mg via INTRAVENOUS

## 2018-01-04 MED ORDER — PANTOPRAZOLE SODIUM 40 MG PO TBEC
40.0000 mg | DELAYED_RELEASE_TABLET | Freq: Every day | ORAL | Status: DC
  Administered 2018-01-04 – 2018-01-05 (×2): 40 mg via ORAL
  Filled 2018-01-04 (×2): qty 1

## 2018-01-04 MED ORDER — PHENYLEPHRINE HCL 10 MG/ML IJ SOLN
INTRAMUSCULAR | Status: DC | PRN
  Administered 2018-01-04 (×6): 80 ug via INTRAVENOUS

## 2018-01-04 MED ORDER — ASPIRIN EC 325 MG PO TBEC
325.0000 mg | DELAYED_RELEASE_TABLET | Freq: Every day | ORAL | Status: DC
  Administered 2018-01-05: 325 mg via ORAL
  Filled 2018-01-04: qty 1

## 2018-01-04 MED ORDER — LIDOCAINE HCL (CARDIAC) PF 100 MG/5ML IV SOSY
PREFILLED_SYRINGE | INTRAVENOUS | Status: DC | PRN
  Administered 2018-01-04: 80 mg via INTRAVENOUS

## 2018-01-04 MED ORDER — ONDANSETRON HCL 4 MG/2ML IJ SOLN
INTRAMUSCULAR | Status: DC | PRN
  Administered 2018-01-04: 4 mg via INTRAVENOUS

## 2018-01-04 MED ORDER — LACTATED RINGERS IV SOLN
INTRAVENOUS | Status: DC | PRN
  Administered 2018-01-04 (×2): via INTRAVENOUS

## 2018-01-04 MED ORDER — HEPARIN SODIUM (PORCINE) 1000 UNIT/ML IJ SOLN
INTRAMUSCULAR | Status: AC
  Filled 2018-01-04: qty 1

## 2018-01-04 MED ORDER — CEFAZOLIN SODIUM-DEXTROSE 2-4 GM/100ML-% IV SOLN
2.0000 g | INTRAVENOUS | Status: AC
  Administered 2018-01-04: 2 g via INTRAVENOUS

## 2018-01-04 MED ORDER — PROPOFOL 10 MG/ML IV BOLUS
INTRAVENOUS | Status: AC
  Filled 2018-01-04: qty 20

## 2018-01-04 MED ORDER — ATORVASTATIN CALCIUM 10 MG PO TABS
10.0000 mg | ORAL_TABLET | Freq: Every day | ORAL | Status: DC
  Administered 2018-01-04 – 2018-01-05 (×2): 10 mg via ORAL
  Filled 2018-01-04 (×2): qty 1

## 2018-01-04 MED ORDER — HEMOSTATIC AGENTS (NO CHARGE) OPTIME
TOPICAL | Status: DC | PRN
  Administered 2018-01-04: 1 via TOPICAL

## 2018-01-04 MED ORDER — 0.9 % SODIUM CHLORIDE (POUR BTL) OPTIME
TOPICAL | Status: DC | PRN
  Administered 2018-01-04 (×2): 1000 mL

## 2018-01-04 MED ORDER — MAGNESIUM SULFATE 2 GM/50ML IV SOLN: 2.0000 g | Freq: Every day | INTRAVENOUS | Status: DC | PRN

## 2018-01-04 MED ORDER — METOPROLOL TARTRATE 5 MG/5ML IV SOLN: 2.0000 mg | INTRAVENOUS | Status: DC | PRN

## 2018-01-04 MED ORDER — CHLORHEXIDINE GLUCONATE 4 % EX LIQD: 60.0000 mL | Freq: Once | CUTANEOUS | Status: DC

## 2018-01-04 MED ORDER — ENOXAPARIN SODIUM 40 MG/0.4ML ~~LOC~~ SOLN: 40.0000 mg | SUBCUTANEOUS | Status: DC

## 2018-01-04 MED ORDER — SODIUM CHLORIDE 0.9 % IV SOLN
INTRAVENOUS | Status: DC | PRN
  Administered 2018-01-04: 08:00:00

## 2018-01-04 MED ORDER — PROTAMINE SULFATE 10 MG/ML IV SOLN
INTRAVENOUS | Status: AC
  Filled 2018-01-04: qty 5

## 2018-01-04 MED ORDER — PHENYLEPHRINE HCL 10 MG/ML IJ SOLN
INTRAVENOUS | Status: DC | PRN
  Administered 2018-01-04: 40 ug/min via INTRAVENOUS

## 2018-01-04 MED ORDER — DEXAMETHASONE SODIUM PHOSPHATE 4 MG/ML IJ SOLN
INTRAMUSCULAR | Status: DC | PRN
  Administered 2018-01-04: 10 mg via INTRAVENOUS

## 2018-01-04 MED ORDER — HEPARIN SODIUM (PORCINE) 1000 UNIT/ML IJ SOLN
INTRAMUSCULAR | Status: DC | PRN
  Administered 2018-01-04: 6000 [IU] via INTRAVENOUS
  Administered 2018-01-04: 2000 [IU] via INTRAVENOUS

## 2018-01-04 MED ORDER — LABETALOL HCL 5 MG/ML IV SOLN: 10.0000 mg | INTRAVENOUS | Status: DC | PRN

## 2018-01-04 MED ORDER — MIDAZOLAM HCL 2 MG/2ML IJ SOLN
INTRAMUSCULAR | Status: AC
  Filled 2018-01-04: qty 2

## 2018-01-04 MED ORDER — ACETAMINOPHEN 325 MG RE SUPP: 325.0000 mg | RECTAL | Status: DC | PRN

## 2018-01-04 MED ORDER — FLEET ENEMA 7-19 GM/118ML RE ENEM: 1.0000 | ENEMA | Freq: Once | RECTAL | Status: DC | PRN

## 2018-01-04 SURGICAL SUPPLY — 44 items
CANISTER SUCT 3000ML PPV (MISCELLANEOUS) ×4 IMPLANT
CATH ROBINSON RED A/P 18FR (CATHETERS) ×4 IMPLANT
CATH SUCT 10FR WHISTLE TIP (CATHETERS) ×4 IMPLANT
CLIP VESOCCLUDE MED 6/CT (CLIP) ×4 IMPLANT
CLIP VESOCCLUDE SM WIDE 6/CT (CLIP) ×4 IMPLANT
COVER PROBE W GEL 5X96 (DRAPES) IMPLANT
CRADLE DONUT ADULT HEAD (MISCELLANEOUS) ×4 IMPLANT
DERMABOND ADVANCED (GAUZE/BANDAGES/DRESSINGS) ×2
DERMABOND ADVANCED .7 DNX12 (GAUZE/BANDAGES/DRESSINGS) ×2 IMPLANT
DRAIN CHANNEL 15F RND FF W/TCR (WOUND CARE) IMPLANT
ELECT REM PT RETURN 9FT ADLT (ELECTROSURGICAL) ×4
ELECTRODE REM PT RTRN 9FT ADLT (ELECTROSURGICAL) ×2 IMPLANT
EVACUATOR SILICONE 100CC (DRAIN) IMPLANT
GLOVE BIOGEL PI IND STRL 7.5 (GLOVE) ×2 IMPLANT
GLOVE BIOGEL PI INDICATOR 7.5 (GLOVE) ×2
GLOVE SURG SS PI 7.5 STRL IVOR (GLOVE) ×4 IMPLANT
GOWN STRL REUS W/ TWL LRG LVL3 (GOWN DISPOSABLE) ×4 IMPLANT
GOWN STRL REUS W/ TWL XL LVL3 (GOWN DISPOSABLE) ×2 IMPLANT
GOWN STRL REUS W/TWL LRG LVL3 (GOWN DISPOSABLE) ×4
GOWN STRL REUS W/TWL XL LVL3 (GOWN DISPOSABLE) ×2
HEMOSTAT SNOW SURGICEL 2X4 (HEMOSTASIS) ×4 IMPLANT
INSERT FOGARTY SM (MISCELLANEOUS) IMPLANT
KIT BASIN OR (CUSTOM PROCEDURE TRAY) ×4 IMPLANT
KIT SHUNT ARGYLE CAROTID ART 6 (VASCULAR PRODUCTS) IMPLANT
KIT TURNOVER KIT B (KITS) ×4 IMPLANT
NEEDLE HYPO 25GX1X1/2 BEV (NEEDLE) IMPLANT
NS IRRIG 1000ML POUR BTL (IV SOLUTION) ×12 IMPLANT
PACK CAROTID (CUSTOM PROCEDURE TRAY) ×4 IMPLANT
PAD ARMBOARD 7.5X6 YLW CONV (MISCELLANEOUS) ×8 IMPLANT
PATCH VASC XENOSURE 1CMX6CM (Vascular Products) ×2 IMPLANT
PATCH VASC XENOSURE 1X6 (Vascular Products) ×2 IMPLANT
SHUNT CAROTID BYPASS 10 (VASCULAR PRODUCTS) IMPLANT
SHUNT CAROTID BYPASS 12FRX15.5 (VASCULAR PRODUCTS) IMPLANT
SUT ETHILON 3 0 PS 1 (SUTURE) IMPLANT
SUT PROLENE 6 0 BV (SUTURE) ×12 IMPLANT
SUT PROLENE 7 0 BV 1 (SUTURE) ×4 IMPLANT
SUT SILK 3 0 (SUTURE)
SUT SILK 3-0 18XBRD TIE 12 (SUTURE) IMPLANT
SUT VIC AB 3-0 SH 27 (SUTURE) ×4
SUT VIC AB 3-0 SH 27X BRD (SUTURE) ×4 IMPLANT
SUT VICRYL 4-0 PS2 18IN ABS (SUTURE) ×4 IMPLANT
SYR CONTROL 10ML LL (SYRINGE) IMPLANT
TOWEL GREEN STERILE (TOWEL DISPOSABLE) ×4 IMPLANT
WATER STERILE IRR 1000ML POUR (IV SOLUTION) ×4 IMPLANT

## 2018-01-04 NOTE — Telephone Encounter (Signed)
-----   Message from Sharee Pimple, RN sent at 01/04/2018  9:56 AM EDT ----- Regarding: 2-3 weeks post CEA   ----- Message ----- From: Lars Mage, PA-C Sent: 01/04/2018   9:24 AM To: Vvs Charge Pool  F/U in 2-3 weeks with Dr. Myra Gianotti s/p left CEA

## 2018-01-04 NOTE — Transfer of Care (Signed)
Immediate Anesthesia Transfer of Care Note  Patient: Peter Castaneda  Procedure(s) Performed: ENDARTERECTOMY CAROTID LEFT (Left ) PATCH ANGIOPLASTY USING XENOSURE BIOLOGIC PATCH 1cm x 6cm (Left Neck)  Patient Location: PACU  Anesthesia Type:General  Level of Consciousness: awake, alert  and oriented  Airway & Oxygen Therapy: Patient Spontanous Breathing and Patient connected to nasal cannula oxygen  Post-op Assessment: Report given to RN, Post -op Vital signs reviewed and stable and Patient moving all extremities  Post vital signs: Reviewed and stable  Last Vitals:  Vitals Value Taken Time  BP 141/71 01/04/2018 10:17 AM  Temp 36.3 C 01/04/2018 10:16 AM  Pulse 97 01/04/2018 10:23 AM  Resp 18 01/04/2018 10:23 AM  SpO2 95 % 01/04/2018 10:23 AM  Vitals shown include unvalidated device data.  Last Pain:  Vitals:   01/04/18 0641  TempSrc: Oral  PainSc:          Complications: No apparent anesthesia complications

## 2018-01-04 NOTE — Anesthesia Procedure Notes (Signed)
Arterial Line Insertion Start/End5/11/2017 7:32 AM, 01/04/2018 7:40 AM Performed by: Rachel Moulds, CRNA, CRNA  Patient location: OR. Preanesthetic checklist: patient identified, IV checked, site marked, risks and benefits discussed, surgical consent, monitors and equipment checked and pre-op evaluation Lidocaine 1% used for infiltration and patient sedated Left, radial was placed Catheter size: 20 G Hand hygiene performed  and maximum sterile barriers used   Attempts: 2 Procedure performed without using ultrasound guided technique. Following insertion, dressing applied and Biopatch. Post procedure assessment: normal  Patient tolerated the procedure well with no immediate complications.

## 2018-01-04 NOTE — Op Note (Signed)
Patient name: Peter Castaneda MRN: [id number] DOB: 03-Jul-1946 Sex: male  01/04/2018 Pre-operative Diagnosis: Asymptomatic   left carotid stenosis Post-operative diagnosis:  Same Surgeon:  Durene Cal Assistants:  Clinton Gallant Procedure:    left carotid Endarterectomy with bovine pericardial patch angioplasty Anesthesia:  100 Specimens:  none  Findings:  95 %stenosis; Thrombus:  none  Indications: This is a 72 year old gentleman found to have a carotid bruit on physical exam.  This led to an ultrasound which showed greater than 80% left carotid stenosis.  He is asymptomatic.  He comes in today for endarterectomy.  Risks and benefits of the procedure were discussed with the patient.  Procedure:  The patient was identified in the holding area and taken to [facility] OR ROOM 16  The patient was then placed supine on the table.   General endotrachial anesthesia was administered.  The patient was prepped and draped in the usual sterile fashion.  A time out was called and antibiotics were administered.  The incision was made along the anterior border of the left sternocleidomastoid muscle.  Cautery was used to dissect through the subcutaneous tissue.  The platysma muscle was divided with cautery.  The internal jugular vein was exposed along its anterior medial border.  The common facial vein was exposed and then divided between 2-0 silk ties and metal clips.  The common carotid artery was then circumferentially exposed and encircled with an umbilical tape.  The vagus nerve was identified and protected.  Next sharp dissection was used to expose the external carotid artery and the superior thyroid artery.  The were encircled with a blue vessel loop and a 2-0 silk tie respectively.  Finally, the internal carotid was carefully dissected free.  An umbilical tape was placed around the internal carotid artery distal to the diseased segment.  The hypoglossal nerve was visualized throughout and protected.  The patient  was given systemic heparinization.  A bovine carotid patch was selected and prepared on the back table.  A 10 french shunt was also prepared.  After blood pressure readings were appropriate and the heparin had been given time to circulate, the internal carotid artery was occluded with a baby Gregory clamp.  The external and common carotid arteries were then occluded with vascular clamps and the 2-0 tie tightened on the superior thyroid artery.  A #11 blade was used to make an arteriotomy in the common carotid artery.  This was extended with Potts scissors along the anterior and lateral border of the common and internal carotid artery.  Approximately 95% stenosis was identified.  There was no thrombus identified.  The 10 french shunt was not placed as there was excellent backbleeding..  A kleiner kuntz elevator was used to perform endarterectomy.  An eversion endarterectomy was performed in the external carotid artery.  A good distal endpoint was obtained in the internal carotid artery.  The specimen was removed and sent to pathology.  Heparinized saline was used to irrigate the endarterectomized field.  All potential embolic debris was removed.  Bovine pericardial patch angioplasty was then performed using a running 6-0 Prolene.  The common internal and external carotid arteries were all appropriately flushed. The artery was again irrigated with heparin saline.  The anastomosis was then secured. The clamp was first released on the external carotid artery followed by the common carotid artery approximately 30 seconds later, bloodflow was reestablish through the internal carotid artery.  Next, a hand-held  Doppler was used to evaluate the signals in the  common, external, and internal  carotid arteries, all of which had appropriate signals. I then administered  50 mg protamine. The wound was then irrigated.  After hemostasis was achieved, the carotid sheath was reapproximated with 3-0 Vicryl. The  platysma muscle  was reapproximated with running 3-0 Vicryl. The skin  was closed with 4-0 Vicryl. Dermabond was placed on the skin. The  patient was then successfully extubated. His neurologic exam was  similar to his preprocedural exam. The patient was then taken to recovery room  in stable condition. There were no complications.     Disposition:  To PACU in stable condition.  Relevant Operative Details: Normal anatomy.  Calcified plaque at the carotid bifurcation with approximately 95% stenosis.  No shunt was utilized as there was excellent backbleeding  V. Durene Cal, M.D. [facility] [place] Office: [phone] Pager:  [phone]

## 2018-01-04 NOTE — Discharge Instructions (Signed)
   [facility] [place]  Discharge Instructions   Carotid Endarterectomy (CEA)  Please refer to the following instructions for your post-procedure care. Your surgeon or physician assistant will discuss any changes with you.  Activity  You are encouraged to walk as much as you can. You can slowly return to normal activities but must avoid strenuous activity and heavy lifting until your doctor tell you it's OK. Avoid activities such as vacuuming or swinging a golf club. You can drive after one week if you are comfortable and you are no longer taking prescription pain medications. It is normal to feel tired for serval weeks after your surgery. It is also normal to have difficulty with sleep habits, eating, and bowel movements after surgery. These will go away with time.  Bathing/Showering  You may shower after you come home. Do not soak in a bathtub, hot tub, or swim until the incision heals completely.  Incision Care  Shower every day. Clean your incision with mild soap and water. Pat the area dry with a clean towel. You do not need a bandage unless otherwise instructed. Do not apply any ointments or creams to your incision. You may have skin glue on your incision. Do not peel it off. It will come off on its own in about one week. Your incision may feel thickened and raised for several weeks after your surgery. This is normal and the skin will soften over time. For Men Only: It's OK to shave around the incision but do not shave the incision itself for 2 weeks. It is common to have numbness under your chin that could last for several months.  Diet  Resume your normal diet. There are no special food restrictions following this procedure. A low fat/low cholesterol diet is recommended for all patients with vascular disease. In order to heal from your surgery, it is CRITICAL to get adequate nutrition. Your body requires vitamins, minerals, and protein. Vegetables are the best  source of vitamins and minerals. Vegetables also provide the perfect balance of protein. Processed food has little nutritional value, so try to avoid this.        Medications  Resume taking all of your medications unless your doctor or physician assistant tells you not to. If your incision is causing pain, you may take over-the- counter pain relievers such as acetaminophen (Tylenol). If you were prescribed a stronger pain medication, please be aware these medications can cause nausea and constipation. Prevent nausea by taking the medication with a snack or meal. Avoid constipation by drinking plenty of fluids and eating foods with a high amount of fiber, such as fruits, vegetables, and grains. Do not take Tylenol if you are taking prescription pain medications.  Follow Up  Our office will schedule a follow up appointment 2-3 weeks following discharge.  Please call us immediately for any of the following conditions  Increased pain, redness, drainage (pus) from your incision site. Fever of 101 degrees or higher. If you should develop stroke (slurred speech, difficulty swallowing, weakness on one side of your body, loss of vision) you should call 911 and go to the nearest emergency room.  Reduce your risk of vascular disease:  Stop smoking. If you would like help call [facility] at 1-800-QUIT-NOW (1-800-784-8669) or [place] at 336-586-4000. Manage your cholesterol Maintain a desired weight Control your diabetes Keep your blood pressure down  If you have any questions, please call the office at [phone].   

## 2018-01-04 NOTE — Anesthesia Preprocedure Evaluation (Addendum)
Anesthesia Evaluation  Patient identified by MRN, date of birth, ID band Patient awake    Reviewed: Allergy & Precautions, H&P , NPO status , Patient's Chart, lab work & pertinent test results  Airway Mallampati: II  TM Distance: >3 FB Neck ROM: full    Dental  (+) Edentulous Upper, Edentulous Lower, Dental Advisory Given   Pulmonary shortness of breath, COPD, former smoker,    breath sounds clear to auscultation       Cardiovascular + Peripheral Vascular Disease   Rhythm:regular Rate:Normal     Neuro/Psych PSYCHIATRIC DISORDERS Anxiety    GI/Hepatic   Endo/Other    Renal/GU      Musculoskeletal  (+) Arthritis ,   Abdominal   Peds  Hematology   Anesthesia Other Findings   Reproductive/Obstetrics                            Anesthesia Physical Anesthesia Plan  ASA: II  Anesthesia Plan: General   Post-op Pain Management:    Induction: Intravenous  PONV Risk Score and Plan: 2 and Ondansetron, Dexamethasone and Treatment may vary due to age or medical condition  Airway Management Planned: Oral ETT  Additional Equipment:   Intra-op Plan:   Post-operative Plan: Extubation in OR  Informed Consent: I have reviewed the patients History and Physical, chart, labs and discussed the procedure including the risks, benefits and alternatives for the proposed anesthesia with the patient or authorized representative who has indicated his/her understanding and acceptance.     Plan Discussed with: CRNA, Anesthesiologist and Surgeon  Anesthesia Plan Comments:         Anesthesia Quick Evaluation

## 2018-01-04 NOTE — Anesthesia Postprocedure Evaluation (Signed)
Anesthesia Post Note  Patient: Peter Castaneda  Procedure(s) Performed: ENDARTERECTOMY CAROTID LEFT (Left ) PATCH ANGIOPLASTY USING XENOSURE BIOLOGIC PATCH 1cm x 6cm (Left Neck)     Patient location during evaluation: PACU Anesthesia Type: General Level of consciousness: awake and alert Pain management: pain level controlled Vital Signs Assessment: post-procedure vital signs reviewed and stable Respiratory status: spontaneous breathing, nonlabored ventilation, respiratory function stable and patient connected to nasal cannula oxygen Cardiovascular status: blood pressure returned to baseline and stable Postop Assessment: no apparent nausea or vomiting Anesthetic complications: no    Last Vitals:  Vitals:   01/04/18 1245 01/04/18 1300  BP: (!) 116/47   Pulse: 86 84  Resp: 17 17  Temp:    SpO2: 98% 100%    Last Pain:  Vitals:   01/04/18 1215  TempSrc:   PainSc: 0-No pain                 Bucky Grigg S

## 2018-01-04 NOTE — Interval H&P Note (Signed)
History and Physical Interval Note:  01/04/2018 7:25 AM  Peter Castaneda  has presented today for surgery, with the diagnosis of LEFT CAROTID STENOSIS  The various methods of treatment have been discussed with the patient and family. After consideration of risks, benefits and other options for treatment, the patient has consented to  Procedure(s): ENDARTERECTOMY CAROTID LEFT (Left) as a surgical intervention .  The patient's history has been reviewed, patient examined, no change in status, stable for surgery.  I have reviewed the patient's chart and labs.  Questions were answered to the patient's satisfaction.     Durene Cal

## 2018-01-04 NOTE — Telephone Encounter (Signed)
sch appt 01/18/18 @ 3:45. pt number not working DPR is wrong number mailed letter. 01/04/18 LC

## 2018-01-04 NOTE — Progress Notes (Signed)
    S/P left CEA  Left facial droop temp mandibular nerve neuropraxia Moving all extremities No tongue deviation Left neck incision without hematoma Speech clear  Plan discharge home tomorrow if voiding, ambulating and toleration PO's well.   [name] PA-C

## 2018-01-04 NOTE — Anesthesia Procedure Notes (Signed)
Procedure Name: Intubation Date/Time: 01/04/2018 7:50 AM Performed by: Waldon Sheerin T, CRNA Pre-anesthesia Checklist: Patient identified, Emergency Drugs available, Suction available and Patient being monitored Patient Re-evaluated:Patient Re-evaluated prior to induction Oxygen Delivery Method: Circle system utilized Preoxygenation: Pre-oxygenation with 100% oxygen Induction Type: IV induction Ventilation: Mask ventilation without difficulty Laryngoscope Size: Miller and 3 Grade View: Grade I Tube type: Oral Tube size: 7.5 mm Airway Equipment and Method: Patient positioned with wedge pillow,  Stylet and LTA kit utilized Placement Confirmation: ETT inserted through vocal cords under direct vision,  positive ETCO2 and breath sounds checked- equal and bilateral Secured at: 23 cm Tube secured with: Tape Dental Injury: Teeth and Oropharynx as per pre-operative assessment

## 2018-01-05 LAB — BASIC METABOLIC PANEL
Anion gap: 9 (ref 5–15)
BUN: 19 mg/dL (ref 6–20)
CALCIUM: 9.1 mg/dL (ref 8.9–10.3)
CHLORIDE: 104 mmol/L (ref 101–111)
CO2: 23 mmol/L (ref 22–32)
CREATININE: 1.25 mg/dL — AB (ref 0.61–1.24)
GFR, EST NON AFRICAN AMERICAN: 56 mL/min — AB (ref >60)
Glucose, Bld: 149 mg/dL — ABNORMAL HIGH (ref 65–99)
Potassium: 4 mmol/L (ref 3.5–5.1)
Sodium: 136 mmol/L (ref 135–145)

## 2018-01-05 LAB — CREATININE, SERUM
Creatinine, Ser: 1.2 mg/dL (ref 0.61–1.24)
GFR calc Af Amer: >60 mL/min (ref >60)
GFR calc non Af Amer: 59 mL/min — ABNORMAL LOW (ref >60)

## 2018-01-05 LAB — CBC
HCT: 34.5 % — ABNORMAL LOW (ref 39.0–52.0)
HCT: 36.8 % — ABNORMAL LOW (ref 39.0–52.0)
HEMOGLOBIN: 12 g/dL — AB (ref 13.0–17.0)
Hemoglobin: 11.6 g/dL — ABNORMAL LOW (ref 13.0–17.0)
MCH: 28.9 pg (ref 26.0–34.0)
MCH: 28.4 pg (ref 26.0–34.0)
MCHC: 33.6 g/dL (ref 30.0–36.0)
MCHC: 32.6 g/dL (ref 30.0–36.0)
MCV: 86 fL (ref 78.0–100.0)
MCV: 87.2 fL (ref 78.0–100.0)
PLATELETS: 176 10*3/uL (ref 150–400)
Platelets: 167 10*3/uL (ref 150–400)
RBC: 4.01 MIL/uL — AB (ref 4.22–5.81)
RBC: 4.22 MIL/uL (ref 4.22–5.81)
RDW: 13.3 % (ref 11.5–15.5)
RDW: 13.6 % (ref 11.5–15.5)
WBC: 12.1 10*3/uL — ABNORMAL HIGH (ref 4.0–10.5)
WBC: 13.6 10*3/uL — ABNORMAL HIGH (ref 4.0–10.5)

## 2018-01-05 MED ORDER — OXYCODONE HCL 5 MG PO TABS: 5.0000 mg | ORAL_TABLET | Freq: Four times a day (QID) | ORAL | 0 refills | Status: AC | PRN

## 2018-01-05 NOTE — Progress Notes (Addendum)
[facility] [place]  Subjective  - Doing well.  Ambulated to the bathroom, voided and tolerating PO's well   Objective (!) 147/67 79 98.1 F (36.7 C) (Oral) 17 96%  Intake/Output Summary (Last 24 hours) at 01/05/2018 0722 Last data filed at 01/05/2018 0503 Gross per 24 hour  Intake 2156.67 ml  Output 825 ml  Net 1331.67 ml    Grip 5/5 B, moving all 4 extremities equally Left neck incision healing well without hematoma No tongue deviation, left facial droop temp mandibular nerve palsy Heart RRR Lungs non labored breathing   Assessment/Planning: POD # 1 left CEA  Asymptomatic left ICA stenosis, right 40-59 % stenosis Stable disposition post op.  Plan to discharge home today.  F/U in 2 weeks with Dr. Myra Gianotti.  I explained the left facial droop should be temporary due to nerve manipulation during his surgery.    He will continue 325 mg asa daily, as well as Lipitor.  [name] 01/05/2018 7:22 AM --  Agree with above.  Neuro intact. No hematoma D/c home  Fabienne Bruns, MD [facility] [place] Office: [phone] Pager: [phone]   Laboratory Lab Results: Recent Labs    01/05/18 0424  WBC 12.1*  HGB 11.6*  HCT 34.5*  PLT 176   BMET Recent Labs    01/05/18 0424  CREATININE 1.20    COAG Lab Results  Component Value Date   INR 1.00 12/24/2017   No results found for: PTT

## 2018-01-06 DIAGNOSIS — F17200 Nicotine dependence, unspecified, uncomplicated: Secondary | ICD-10-CM | POA: Diagnosis not present

## 2018-01-06 DIAGNOSIS — Z48812 Encounter for surgical aftercare following surgery on the circulatory system: Secondary | ICD-10-CM | POA: Diagnosis not present

## 2018-01-06 DIAGNOSIS — J449 Chronic obstructive pulmonary disease, unspecified: Secondary | ICD-10-CM | POA: Diagnosis not present

## 2018-01-06 DIAGNOSIS — Z7982 Long term (current) use of aspirin: Secondary | ICD-10-CM | POA: Diagnosis not present

## 2018-01-06 LAB — POCT ACTIVATED CLOTTING TIME: Activated Clotting Time: 235 seconds

## 2018-01-06 NOTE — Discharge Summary (Signed)
[facility] Discharge Summary   Patient ID:  Peter Castaneda MRN: [id number] DOB/AGE: 10-26-1945 72 y.o.  Admit date: 01/04/2018 Discharge date: 01/06/2018 Date of Surgery: 01/05/2018 Surgeon: Surgeon(s): Nada Libman, MD  Admission Diagnosis: LEFT CAROTID STENOSIS  Discharge Diagnoses:  LEFT CAROTID STENOSIS  Secondary Diagnoses: Past Medical History:  Diagnosis Date  . Anxiety   . Arthritis   . Carotid artery occlusion   . COPD (chronic obstructive pulmonary disease) (HCC)   . Dyspnea   . Heart murmur   . Low blood pressure    Entered from new patient packet     Procedure(s): ENDARTERECTOMY CAROTID LEFT PATCH ANGIOPLASTY USING XENOSURE BIOLOGIC PATCH 1cm x 6cm  Discharged Condition: good  HPI: 72 y/o male with asymptomatic left carotid stenosis > 80% found in our office secondary to audible bruit.   He was scheduled for left CEA by Dr. Myra Gianotti.     Hospital Course:  Peter Castaneda is a 72 y.o. male is S/P Left Procedure(s): ENDARTERECTOMY CAROTID LEFT PATCH ANGIOPLASTY USING XENOSURE BIOLOGIC PATCH 1cm x 6cm Asymptomatic left ICA stenosis, right 40-59 % stenosis Stable disposition post op.  Plan to discharge home today.  F/U in 2 weeks with Dr. Myra Gianotti.  I explained the left facial droop should be temporary due to nerve manipulation during his surgery.               He will continue 325 mg asa daily, as well as Lipitor.        Significant Diagnostic Studies: CBC Lab Results  Component Value Date   WBC 13.6 (H) 01/05/2018   HGB 12.0 (L) 01/05/2018   HCT 36.8 (L) 01/05/2018   MCV 87.2 01/05/2018   PLT 167 01/05/2018    BMET    Component Value Date/Time   NA 136 01/05/2018 0754   K 4.0 01/05/2018 0754   CL 104 01/05/2018 0754   CO2 23 01/05/2018 0754   GLUCOSE 149 (H) 01/05/2018 0754   BUN 19 01/05/2018 0754   CREATININE 1.25 (H) 01/05/2018 0754   CREATININE 1.04 08/01/2017 1010   CALCIUM 9.1 01/05/2018 0754   GFRNONAA  56 (L) 01/05/2018 0754   GFRNONAA 72 08/01/2017 1010   GFRAA >60 01/05/2018 0754   GFRAA 83 08/01/2017 1010   COAG Lab Results  Component Value Date   INR 1.00 12/24/2017     Disposition:  Discharge to :Home Discharge Instructions    Call MD for:  redness, tenderness, or signs of infection (pain, swelling, bleeding, redness, odor or green/yellow discharge around incision site)   Complete by:  As directed    Call MD for:  severe or increased pain, loss or decreased feeling  in affected limb(s)   Complete by:  As directed    Call MD for:  temperature >100.5   Complete by:  As directed    Resume previous diet   Complete by:  As directed      Allergies as of 01/05/2018   No Known Allergies     Medication List    TAKE these medications   albuterol 108 (90 Base) MCG/ACT inhaler Commonly known as:  PROVENTIL HFA;VENTOLIN HFA Inhale 2 puffs into the lungs every 4 (four) hours as needed for wheezing or shortness of breath.   aspirin 325 MG EC tablet Take 325 mg daily by mouth.   atorvastatin 10 MG tablet Commonly known as:  LIPITOR Take 1 tablet (10 mg total) by mouth daily. For cholesterol What changed:  when to take this  additional instructions   multivitamin with minerals Tabs tablet Take 1 tablet by mouth 2 (two) times a week.   oxyCODONE 5 MG immediate release tablet Commonly known as:  Oxy IR/ROXICODONE Take 1 tablet (5 mg total) by mouth every 6 (six) hours as needed for moderate pain.   oxyCODONE-acetaminophen 5-325 MG tablet Commonly known as:  PERCOCET/ROXICET Take 1 tablet by mouth every 6 (six) hours as needed.      Verbal and written Discharge instructions given to the patient. Wound care per Discharge AVS Follow-up Information    Follow up In 2 weeks.   Why:  Office will call you to arrange your appt (sent)          Signed: [name] 01/06/2018, 2:46 PM --- For VQI Registry use --- Instructions: Press F2 to tab through  selections.  Delete question if not applicable.   Modified Rankin score at D/C (0-6): Rankin Score=0  IV medication needed for:  1. Hypertension: No 2. Hypotension: No  Post-op Complications: No  1. Post-op CVA or TIA: No  If yes: Event classification (right eye, left eye, right cortical, left cortical, verterobasilar, other):   If yes: Timing of event (intra-op, <6 hrs post-op, >=6 hrs post-op, unknown):   2. CN injury: No  If yes: CN  injuried   3. Myocardial infarction: No  If yes: Dx by (EKG or clinical, Troponin):   4.  CHF: No  5.  Dysrhythmia (new): No  6. Wound infection: No  7. Reperfusion symptoms: No  8. Return to OR: No  If yes: return to OR for (bleeding, neurologic, other CEA incision, other):   Discharge medications: Statin use:  Yes ASA use:  Yes Beta blocker use:  No  for medical reason   ACE-Inhibitor use:  No  for medical reason   P2Y12 Antagonist use: [x ] None,  Plavix,  Plasugrel,  Ticlopinine,  Ticagrelor,  Other,  No for medical reason,  Non-compliant,  Not-indicated Anti-coagulant use:  [x ] None,  Warfarin,  Rivaroxaban,  Dabigatran,  Other,  No for medical reason,  Non-compliant,  Not-indicated

## 2018-01-07 ENCOUNTER — Encounter: Payer: Medicare Other | Admitting: Surgery

## 2018-01-07 ENCOUNTER — Encounter (HOSPITAL_COMMUNITY): Payer: Medicare Other

## 2018-01-07 ENCOUNTER — Encounter

## 2018-01-07 ENCOUNTER — Encounter (HOSPITAL_COMMUNITY): Payer: Self-pay | Admitting: Surgery

## 2018-01-11 ENCOUNTER — Other Ambulatory Visit: Payer: Self-pay

## 2018-01-11 DIAGNOSIS — J449 Chronic obstructive pulmonary disease, unspecified: Secondary | ICD-10-CM | POA: Diagnosis not present

## 2018-01-11 DIAGNOSIS — Z7982 Long term (current) use of aspirin: Secondary | ICD-10-CM | POA: Diagnosis not present

## 2018-01-11 DIAGNOSIS — Z48812 Encounter for surgical aftercare following surgery on the circulatory system: Secondary | ICD-10-CM | POA: Diagnosis not present

## 2018-01-11 DIAGNOSIS — F17200 Nicotine dependence, unspecified, uncomplicated: Secondary | ICD-10-CM | POA: Diagnosis not present

## 2018-01-14 DIAGNOSIS — Z7982 Long term (current) use of aspirin: Secondary | ICD-10-CM | POA: Diagnosis not present

## 2018-01-14 DIAGNOSIS — Z48812 Encounter for surgical aftercare following surgery on the circulatory system: Secondary | ICD-10-CM | POA: Diagnosis not present

## 2018-01-14 DIAGNOSIS — J449 Chronic obstructive pulmonary disease, unspecified: Secondary | ICD-10-CM | POA: Diagnosis not present

## 2018-01-14 DIAGNOSIS — F17200 Nicotine dependence, unspecified, uncomplicated: Secondary | ICD-10-CM | POA: Diagnosis not present

## 2018-01-15 ENCOUNTER — Ambulatory Visit: Payer: Medicare Other | Admitting: Internal Medicine

## 2018-01-17 DIAGNOSIS — F17200 Nicotine dependence, unspecified, uncomplicated: Secondary | ICD-10-CM | POA: Diagnosis not present

## 2018-01-17 DIAGNOSIS — J449 Chronic obstructive pulmonary disease, unspecified: Secondary | ICD-10-CM | POA: Diagnosis not present

## 2018-01-17 DIAGNOSIS — Z7982 Long term (current) use of aspirin: Secondary | ICD-10-CM | POA: Diagnosis not present

## 2018-01-17 DIAGNOSIS — Z48812 Encounter for surgical aftercare following surgery on the circulatory system: Secondary | ICD-10-CM | POA: Diagnosis not present

## 2018-01-18 ENCOUNTER — Encounter

## 2018-01-18 ENCOUNTER — Other Ambulatory Visit: Payer: Self-pay

## 2018-01-18 ENCOUNTER — Encounter: Payer: Self-pay | Admitting: Surgery

## 2018-01-18 ENCOUNTER — Ambulatory Visit (INDEPENDENT_AMBULATORY_CARE_PROVIDER_SITE_OTHER): Payer: Medicare Other | Admitting: Surgery

## 2018-01-18 VITALS — BP 151/95 | HR 107 | Temp 97.1°F | Resp 16 | Ht 68.0 in | Wt 129.0 lb

## 2018-01-18 DIAGNOSIS — I6522 Occlusion and stenosis of left carotid artery: Secondary | ICD-10-CM

## 2018-01-18 NOTE — Progress Notes (Signed)
   Patient name: Peter Castaneda MRN: [id number] DOB: March 02, 1946 Sex: male  REASON FOR VISIT:     post op  HISTORY OF PRESENT ILLNESS:   Peter Castaneda is a 72 y.o. male who I met in January, 2019 for left hip claudication.  He is being treated medically.  During that visit a left carotid bruit was obtained however we did not get a ultrasound at that time due to financial issues.  He went on to get an ultrasound at [facility] care which showed a greater than 80% left carotid stenosis.  He is asymptomatic.  On 01/04/2018 he underwent left carotid endarterectomy.  Intraoperative findings included a 95% stenosis.  His postop course was uncomplicated.  He has quit smoking.  CURRENT MEDICATIONS:    Current Outpatient Medications  Medication Sig Dispense Refill  . albuterol (PROVENTIL HFA;VENTOLIN HFA) 108 (90 Base) MCG/ACT inhaler Inhale 2 puffs into the lungs every 4 (four) hours as needed for wheezing or shortness of breath. (Patient not taking: Reported on 12/18/2017) 1 Inhaler 6  . aspirin 325 MG EC tablet Take 325 mg daily by mouth.    Marland Kitchen atorvastatin (LIPITOR) 10 MG tablet Take 1 tablet (10 mg total) by mouth daily. For cholesterol (Patient taking differently: Take 10 mg by mouth 3 (three) times a week. For cholesterol) 30 tablet 6  . Multiple Vitamin (MULTIVITAMIN WITH MINERALS) TABS tablet Take 1 tablet by mouth 2 (two) times a week.    Marland Kitchen oxyCODONE (OXY IR/ROXICODONE) 5 MG immediate release tablet Take 1 tablet (5 mg total) by mouth every 6 (six) hours as needed for moderate pain. 6 tablet 0  . oxyCODONE-acetaminophen (PERCOCET/ROXICET) 5-325 MG tablet Take 1 tablet by mouth every 6 (six) hours as needed. 6 tablet 0   No current facility-administered medications for this visit.     REVIEW OF SYSTEMS:   '[X]'$  denotes positive finding, '[ ]'$  denotes negative finding Cardiac  Comments:  Chest pain or chest pressure:    Shortness of breath upon exertion:    Short  of breath when lying flat:    Irregular heart rhythm:    Constitutional    Fever or chills:      PHYSICAL EXAM:   Vitals:   01/18/18 1601  BP: 126/87  Pulse: (!) 107  Resp: 16  Temp: (!) 97.1 F (36.2 C)  TempSrc: Oral  SpO2: 98%  Weight: 129 lb (58.5 kg)  Height: '5\' 8"'$  (1.727 m)    GENERAL: The patient is a well-nourished male, in no acute distress. The vital signs are documented above. CARDIOVASCULAR: There is a regular rate and rhythm. PULMONARY: Non-labored respirations Left carotid incision is well-healed.  There is a slight left facial droop which has improved.  STUDIES:   None   MEDICAL ISSUES:   Status post left carotid endarterectomy.  Patient will follow-up in 6 months with repeat carotid duplex.  We will continue to monitor him for his hip claudication.  Annamarie Major, MD [facility] [phone] Pager [phone]

## 2018-01-18 NOTE — Progress Notes (Signed)
3

## 2018-01-21 DIAGNOSIS — Z7982 Long term (current) use of aspirin: Secondary | ICD-10-CM | POA: Diagnosis not present

## 2018-01-21 DIAGNOSIS — Z48812 Encounter for surgical aftercare following surgery on the circulatory system: Secondary | ICD-10-CM | POA: Diagnosis not present

## 2018-01-21 DIAGNOSIS — F17200 Nicotine dependence, unspecified, uncomplicated: Secondary | ICD-10-CM | POA: Diagnosis not present

## 2018-01-21 DIAGNOSIS — J449 Chronic obstructive pulmonary disease, unspecified: Secondary | ICD-10-CM | POA: Diagnosis not present

## 2018-01-24 DIAGNOSIS — Z48812 Encounter for surgical aftercare following surgery on the circulatory system: Secondary | ICD-10-CM | POA: Diagnosis not present

## 2018-01-24 DIAGNOSIS — Z7982 Long term (current) use of aspirin: Secondary | ICD-10-CM | POA: Diagnosis not present

## 2018-01-24 DIAGNOSIS — F17200 Nicotine dependence, unspecified, uncomplicated: Secondary | ICD-10-CM | POA: Diagnosis not present

## 2018-01-24 DIAGNOSIS — J449 Chronic obstructive pulmonary disease, unspecified: Secondary | ICD-10-CM | POA: Diagnosis not present

## 2018-01-29 DIAGNOSIS — Z48812 Encounter for surgical aftercare following surgery on the circulatory system: Secondary | ICD-10-CM | POA: Diagnosis not present

## 2018-01-29 DIAGNOSIS — Z7982 Long term (current) use of aspirin: Secondary | ICD-10-CM | POA: Diagnosis not present

## 2018-01-29 DIAGNOSIS — F17200 Nicotine dependence, unspecified, uncomplicated: Secondary | ICD-10-CM | POA: Diagnosis not present

## 2018-01-29 DIAGNOSIS — J449 Chronic obstructive pulmonary disease, unspecified: Secondary | ICD-10-CM | POA: Diagnosis not present

## 2018-02-05 ENCOUNTER — Telehealth: Payer: Self-pay | Admitting: *Deleted

## 2018-02-05 DIAGNOSIS — Z7982 Long term (current) use of aspirin: Secondary | ICD-10-CM | POA: Diagnosis not present

## 2018-02-05 DIAGNOSIS — J449 Chronic obstructive pulmonary disease, unspecified: Secondary | ICD-10-CM | POA: Diagnosis not present

## 2018-02-05 DIAGNOSIS — F17200 Nicotine dependence, unspecified, uncomplicated: Secondary | ICD-10-CM | POA: Diagnosis not present

## 2018-02-05 DIAGNOSIS — Z48812 Encounter for surgical aftercare following surgery on the circulatory system: Secondary | ICD-10-CM | POA: Diagnosis not present

## 2018-02-05 NOTE — Telephone Encounter (Signed)
Ok to above 

## 2018-02-05 NOTE — Telephone Encounter (Signed)
Harriett SineNancy with [facility] notified and agreed.

## 2018-02-05 NOTE — Telephone Encounter (Signed)
Harriett SineNancy with [facility] called and requested verbal orders for continuation of OT 1x1, 2x3, 1x1.  I do not see where we ordered OT. Is this ok to give verbal order. Please Advise.

## 2018-02-07 DIAGNOSIS — F17200 Nicotine dependence, unspecified, uncomplicated: Secondary | ICD-10-CM | POA: Diagnosis not present

## 2018-02-07 DIAGNOSIS — Z7982 Long term (current) use of aspirin: Secondary | ICD-10-CM | POA: Diagnosis not present

## 2018-02-07 DIAGNOSIS — Z48812 Encounter for surgical aftercare following surgery on the circulatory system: Secondary | ICD-10-CM | POA: Diagnosis not present

## 2018-02-07 DIAGNOSIS — J449 Chronic obstructive pulmonary disease, unspecified: Secondary | ICD-10-CM | POA: Diagnosis not present

## 2018-02-11 DIAGNOSIS — Z48812 Encounter for surgical aftercare following surgery on the circulatory system: Secondary | ICD-10-CM | POA: Diagnosis not present

## 2018-02-11 DIAGNOSIS — J449 Chronic obstructive pulmonary disease, unspecified: Secondary | ICD-10-CM | POA: Diagnosis not present

## 2018-02-11 DIAGNOSIS — F17200 Nicotine dependence, unspecified, uncomplicated: Secondary | ICD-10-CM | POA: Diagnosis not present

## 2018-02-11 DIAGNOSIS — Z7982 Long term (current) use of aspirin: Secondary | ICD-10-CM | POA: Diagnosis not present

## 2018-02-12 DIAGNOSIS — Z48812 Encounter for surgical aftercare following surgery on the circulatory system: Secondary | ICD-10-CM | POA: Diagnosis not present

## 2018-02-12 DIAGNOSIS — F17200 Nicotine dependence, unspecified, uncomplicated: Secondary | ICD-10-CM | POA: Diagnosis not present

## 2018-02-12 DIAGNOSIS — Z7982 Long term (current) use of aspirin: Secondary | ICD-10-CM | POA: Diagnosis not present

## 2018-02-12 DIAGNOSIS — J449 Chronic obstructive pulmonary disease, unspecified: Secondary | ICD-10-CM | POA: Diagnosis not present

## 2018-02-13 ENCOUNTER — Other Ambulatory Visit: Payer: Self-pay

## 2018-02-13 DIAGNOSIS — I6523 Occlusion and stenosis of bilateral carotid arteries: Secondary | ICD-10-CM

## 2018-02-18 DIAGNOSIS — J449 Chronic obstructive pulmonary disease, unspecified: Secondary | ICD-10-CM | POA: Diagnosis not present

## 2018-02-18 DIAGNOSIS — Z48812 Encounter for surgical aftercare following surgery on the circulatory system: Secondary | ICD-10-CM | POA: Diagnosis not present

## 2018-02-18 DIAGNOSIS — F17200 Nicotine dependence, unspecified, uncomplicated: Secondary | ICD-10-CM | POA: Diagnosis not present

## 2018-02-18 DIAGNOSIS — Z7982 Long term (current) use of aspirin: Secondary | ICD-10-CM | POA: Diagnosis not present

## 2018-02-19 DIAGNOSIS — Z48812 Encounter for surgical aftercare following surgery on the circulatory system: Secondary | ICD-10-CM | POA: Diagnosis not present

## 2018-02-19 DIAGNOSIS — Z7982 Long term (current) use of aspirin: Secondary | ICD-10-CM | POA: Diagnosis not present

## 2018-02-19 DIAGNOSIS — F17200 Nicotine dependence, unspecified, uncomplicated: Secondary | ICD-10-CM | POA: Diagnosis not present

## 2018-02-19 DIAGNOSIS — J449 Chronic obstructive pulmonary disease, unspecified: Secondary | ICD-10-CM | POA: Diagnosis not present

## 2018-02-21 DIAGNOSIS — Z48812 Encounter for surgical aftercare following surgery on the circulatory system: Secondary | ICD-10-CM | POA: Diagnosis not present

## 2018-02-21 DIAGNOSIS — Z7982 Long term (current) use of aspirin: Secondary | ICD-10-CM | POA: Diagnosis not present

## 2018-02-21 DIAGNOSIS — F17200 Nicotine dependence, unspecified, uncomplicated: Secondary | ICD-10-CM | POA: Diagnosis not present

## 2018-02-21 DIAGNOSIS — J449 Chronic obstructive pulmonary disease, unspecified: Secondary | ICD-10-CM | POA: Diagnosis not present

## 2018-02-25 DIAGNOSIS — Z48812 Encounter for surgical aftercare following surgery on the circulatory system: Secondary | ICD-10-CM | POA: Diagnosis not present

## 2018-02-25 DIAGNOSIS — Z7982 Long term (current) use of aspirin: Secondary | ICD-10-CM | POA: Diagnosis not present

## 2018-02-25 DIAGNOSIS — F17200 Nicotine dependence, unspecified, uncomplicated: Secondary | ICD-10-CM | POA: Diagnosis not present

## 2018-02-25 DIAGNOSIS — J449 Chronic obstructive pulmonary disease, unspecified: Secondary | ICD-10-CM | POA: Diagnosis not present

## 2018-02-28 DIAGNOSIS — Z7982 Long term (current) use of aspirin: Secondary | ICD-10-CM | POA: Diagnosis not present

## 2018-02-28 DIAGNOSIS — F17200 Nicotine dependence, unspecified, uncomplicated: Secondary | ICD-10-CM | POA: Diagnosis not present

## 2018-02-28 DIAGNOSIS — J449 Chronic obstructive pulmonary disease, unspecified: Secondary | ICD-10-CM | POA: Diagnosis not present

## 2018-02-28 DIAGNOSIS — Z48812 Encounter for surgical aftercare following surgery on the circulatory system: Secondary | ICD-10-CM | POA: Diagnosis not present

## 2018-03-05 DIAGNOSIS — Z48812 Encounter for surgical aftercare following surgery on the circulatory system: Secondary | ICD-10-CM | POA: Diagnosis not present

## 2018-03-05 DIAGNOSIS — J449 Chronic obstructive pulmonary disease, unspecified: Secondary | ICD-10-CM | POA: Diagnosis not present

## 2018-03-05 DIAGNOSIS — Z7982 Long term (current) use of aspirin: Secondary | ICD-10-CM | POA: Diagnosis not present

## 2018-03-05 DIAGNOSIS — F17200 Nicotine dependence, unspecified, uncomplicated: Secondary | ICD-10-CM | POA: Diagnosis not present

## 2018-04-24 ENCOUNTER — Encounter: Payer: Self-pay | Admitting: Internal Medicine

## 2018-07-09 ENCOUNTER — Encounter (HOSPITAL_COMMUNITY): Payer: Medicare Other

## 2018-07-09 ENCOUNTER — Ambulatory Visit: Payer: Medicare Other | Admitting: Family

## 2018-07-31 ENCOUNTER — Telehealth: Payer: Self-pay | Admitting: Internal Medicine

## 2018-07-31 DIAGNOSIS — Z23 Encounter for immunization: Secondary | ICD-10-CM | POA: Diagnosis not present

## 2018-07-31 NOTE — Telephone Encounter (Signed)
I left a message asking the patient to come in at 12:30 instead of 12:45 on 09/09/2018. VDM (DD)

## 2018-09-09 ENCOUNTER — Ambulatory Visit: Payer: Self-pay

## 2019-01-16 ENCOUNTER — Telehealth: Payer: Self-pay | Admitting: *Deleted

## 2019-01-16 NOTE — Telephone Encounter (Signed)
Peter Castaneda's phone has calling restrictions.

## 2019-02-05 NOTE — Telephone Encounter (Signed)
A message was left, re: follow up visit. 

## 2019-05-08 IMAGING — RF DG ESOPHAGUS
7 series · 14 of 24 positions shown · non-contrast
Comparison: None.

CLINICAL DATA: Dysphagia.

EXAM:
ESOPHOGRAM/BARIUM SWALLOW
TECHNIQUE: Single contrast examination was performed using  thin barium.
FLUOROSCOPY TIME:  Fluoroscopy Time:  2 minutes and 12 seconds
Radiation Exposure Index (if provided by the fluoroscopic device):
49 mGy
Number of Acquired Spot Images: 0

[Series 1: sequence · 2 of 15 frames shown (1 of 6)]
[frame 3/15]
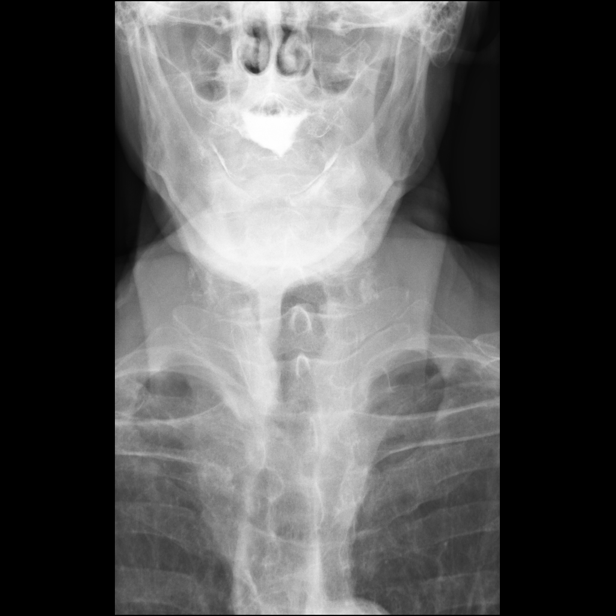
[frame 9/15]
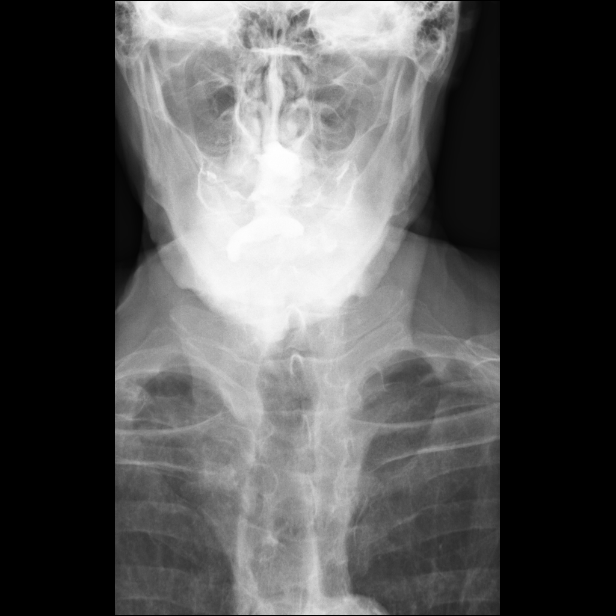

[Series 2: sequence · 3 of 11 frames shown (2 of 6)]
[frame 2/11]
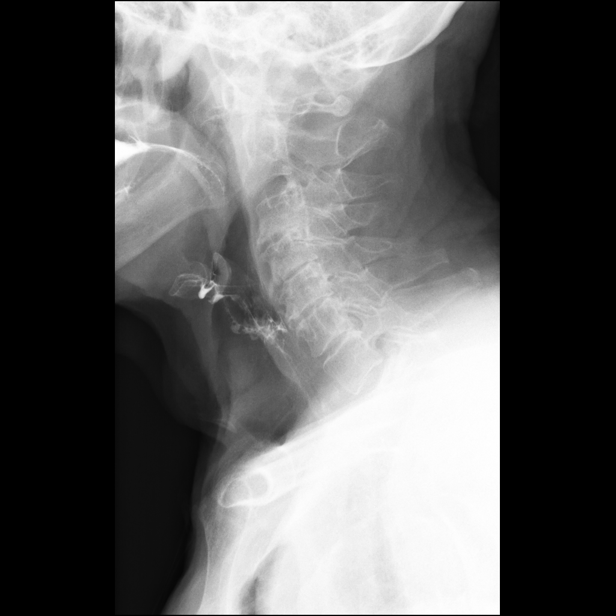
[frame 10/11]
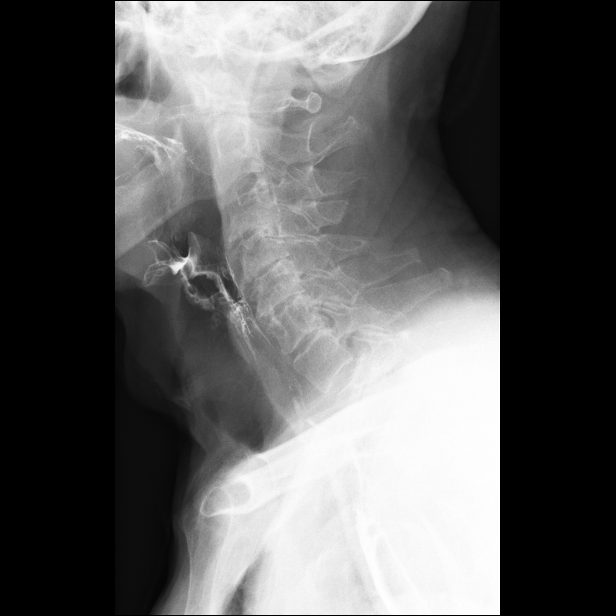
[frame 11/11]
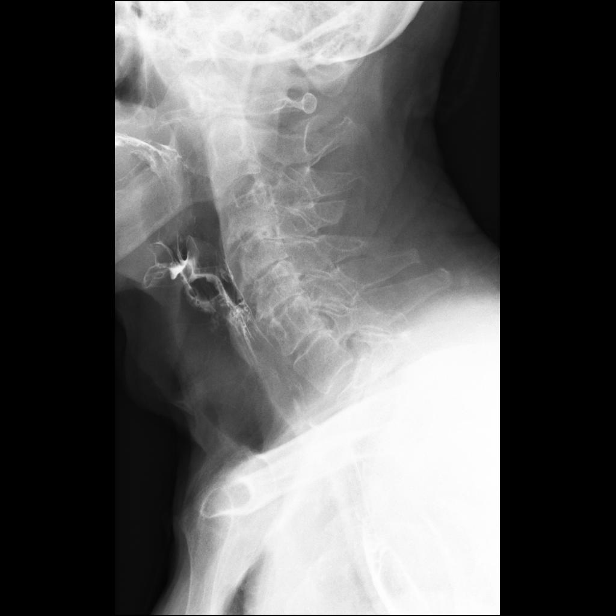

[Series 3: sequence · 2 of 14 frames shown (3 of 6)]
[frame 3/14]
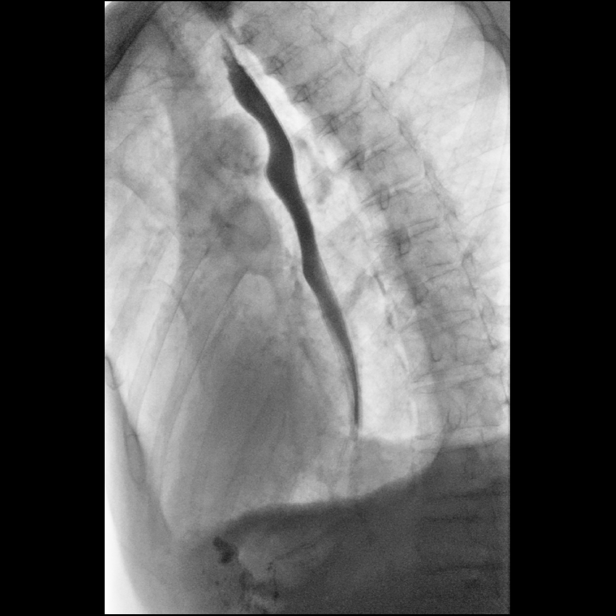
[frame 12/14]
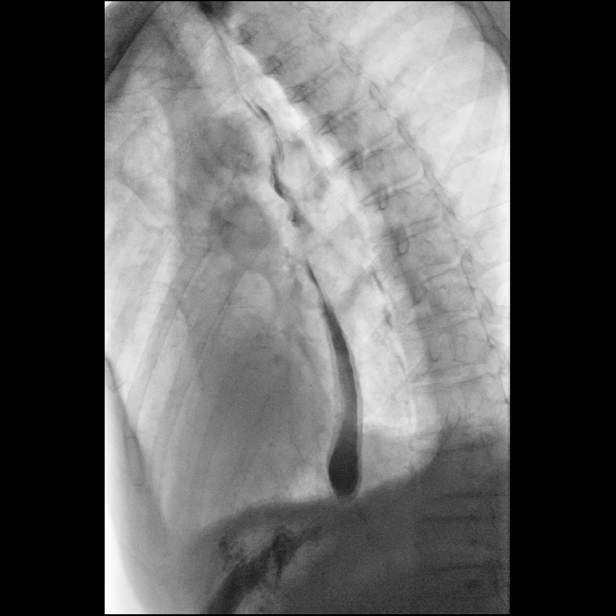

[Series 4: sequence · 2 of 19 frames shown (4 of 6)]
[frame 3/19]
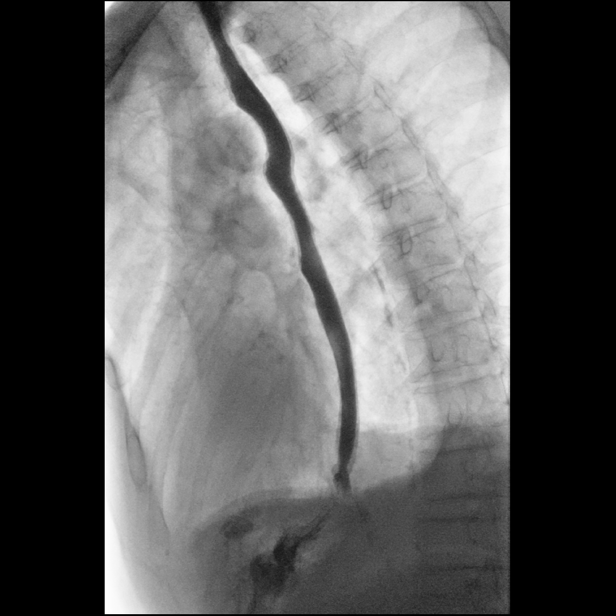
[frame 17/19]
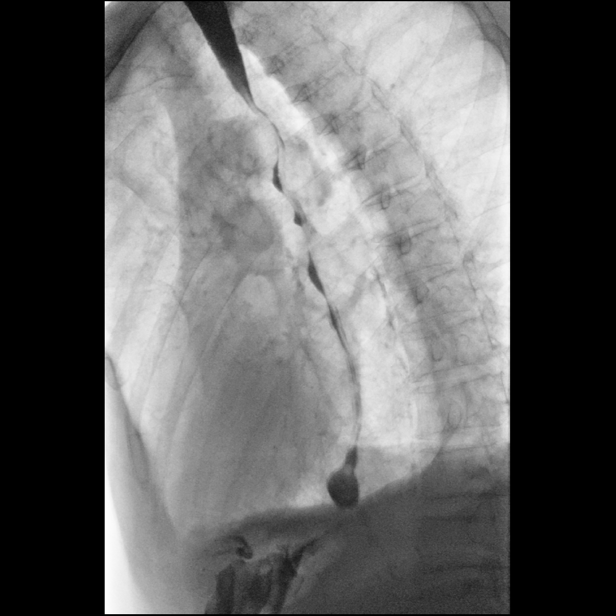

[Series 5: sequence · 2 of 15 frames shown (5 of 6)]
[frame 3/15]
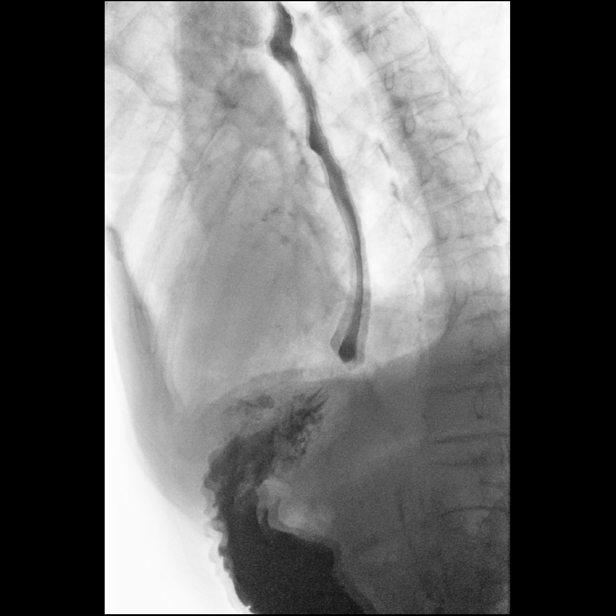
[frame 13/15]
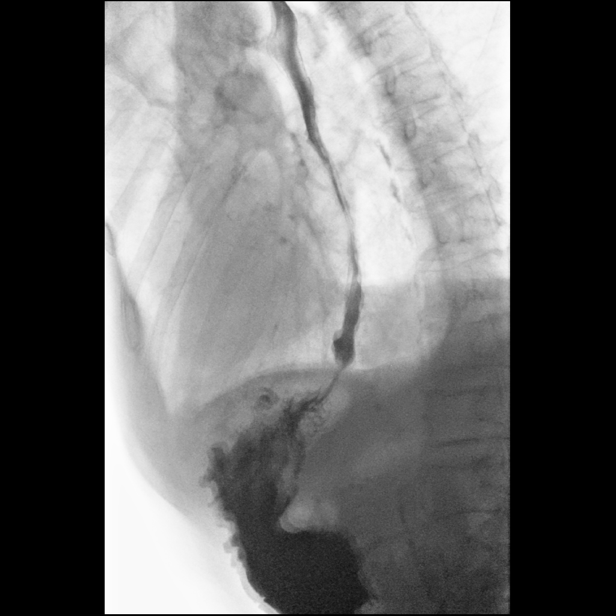

[Series 6: sequence · 2 of 16 frames shown (6 of 6)]
[frame 3/16]
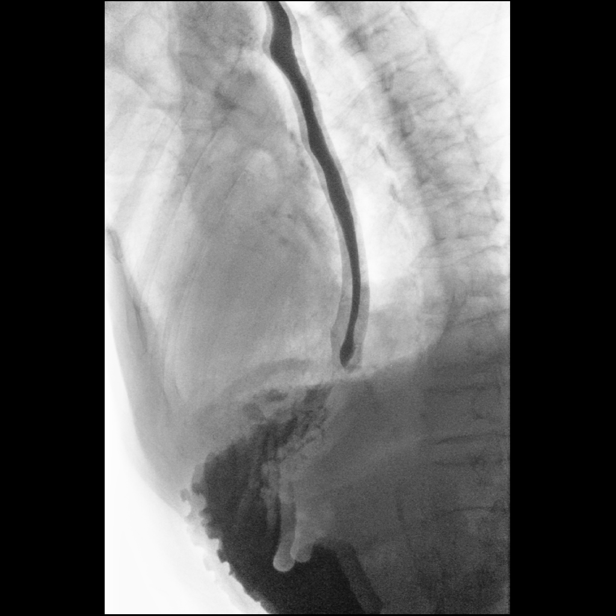
[frame 9/16]
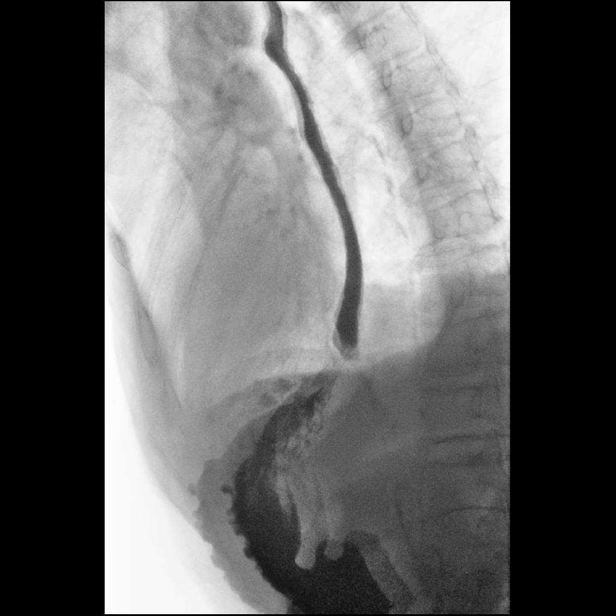

[Series 7: one shot · 1 of 1 slices shown]
[im 1/1]
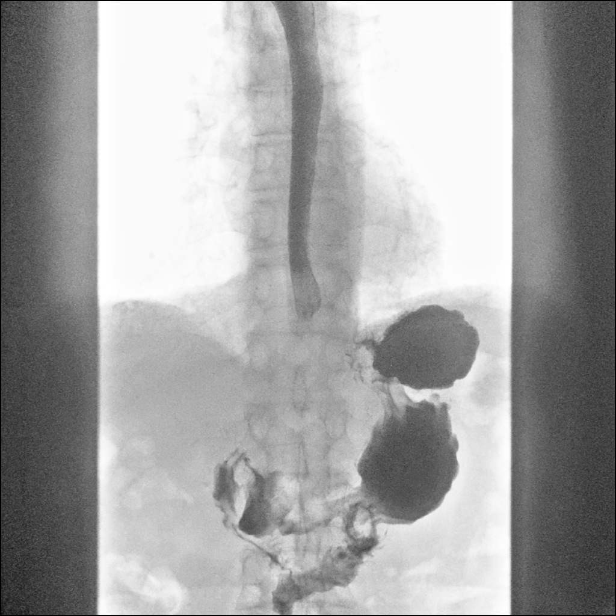

[14 of 24 positions shown; findings below may reference images not displayed]

FINDINGS: Initial barium swallows demonstrate normal pharyngeal motion with
swallowing. No laryngeal penetration or aspiration. No upper
esophageal webs, strictures or diverticuli.

Moderate to advanced degenerative cervical spondylosis with
multilevel disc disease and facet disease. Anterior spurring changes
without significant mass effect on the cervical esophagus.

Esophageal dysmotility with occasional disruption of the primary
peristaltic wave and occasional tertiary contractions. No intrinsic
or extrinsic lesions are identified. No mucosal abnormalities. There
is a small sliding-type hiatal hernia and inducible GE reflux with
water swallowing.

The 13 mm barium pill passed into the stomach without difficulty.
IMPRESSION: 1. Esophageal dysmotility.
2. Small sliding-type hiatal hernia with inducible GE reflux.
3. No mass or stricture.

## 2019-05-08 IMAGING — CR DG CHEST 2V
2 series · 2 of 2 positions shown · non-contrast
Comparison: None.

CLINICAL DATA: Cough and congestion

EXAM:
CHEST  2 VIEW

[w chest pa]
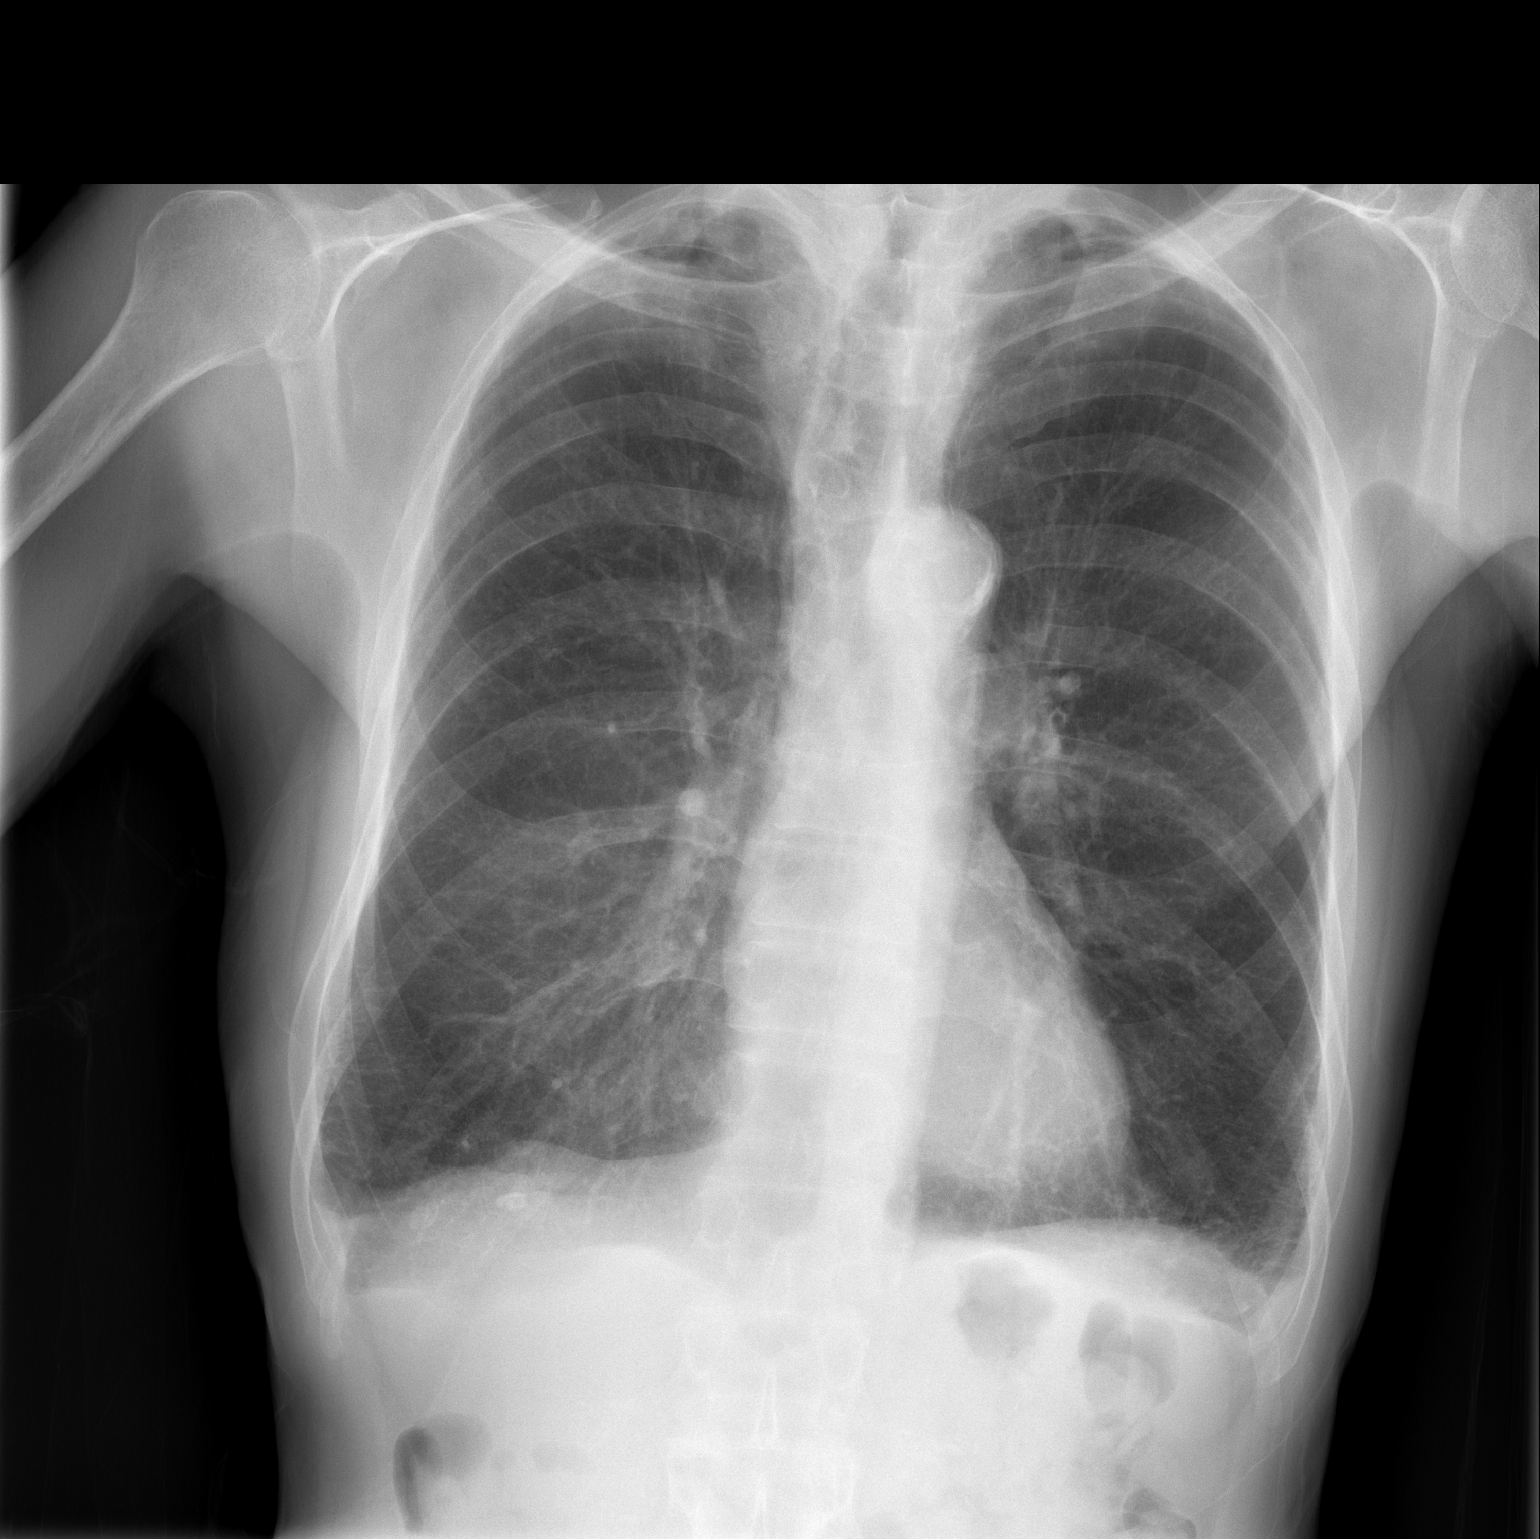

[w chest lat]
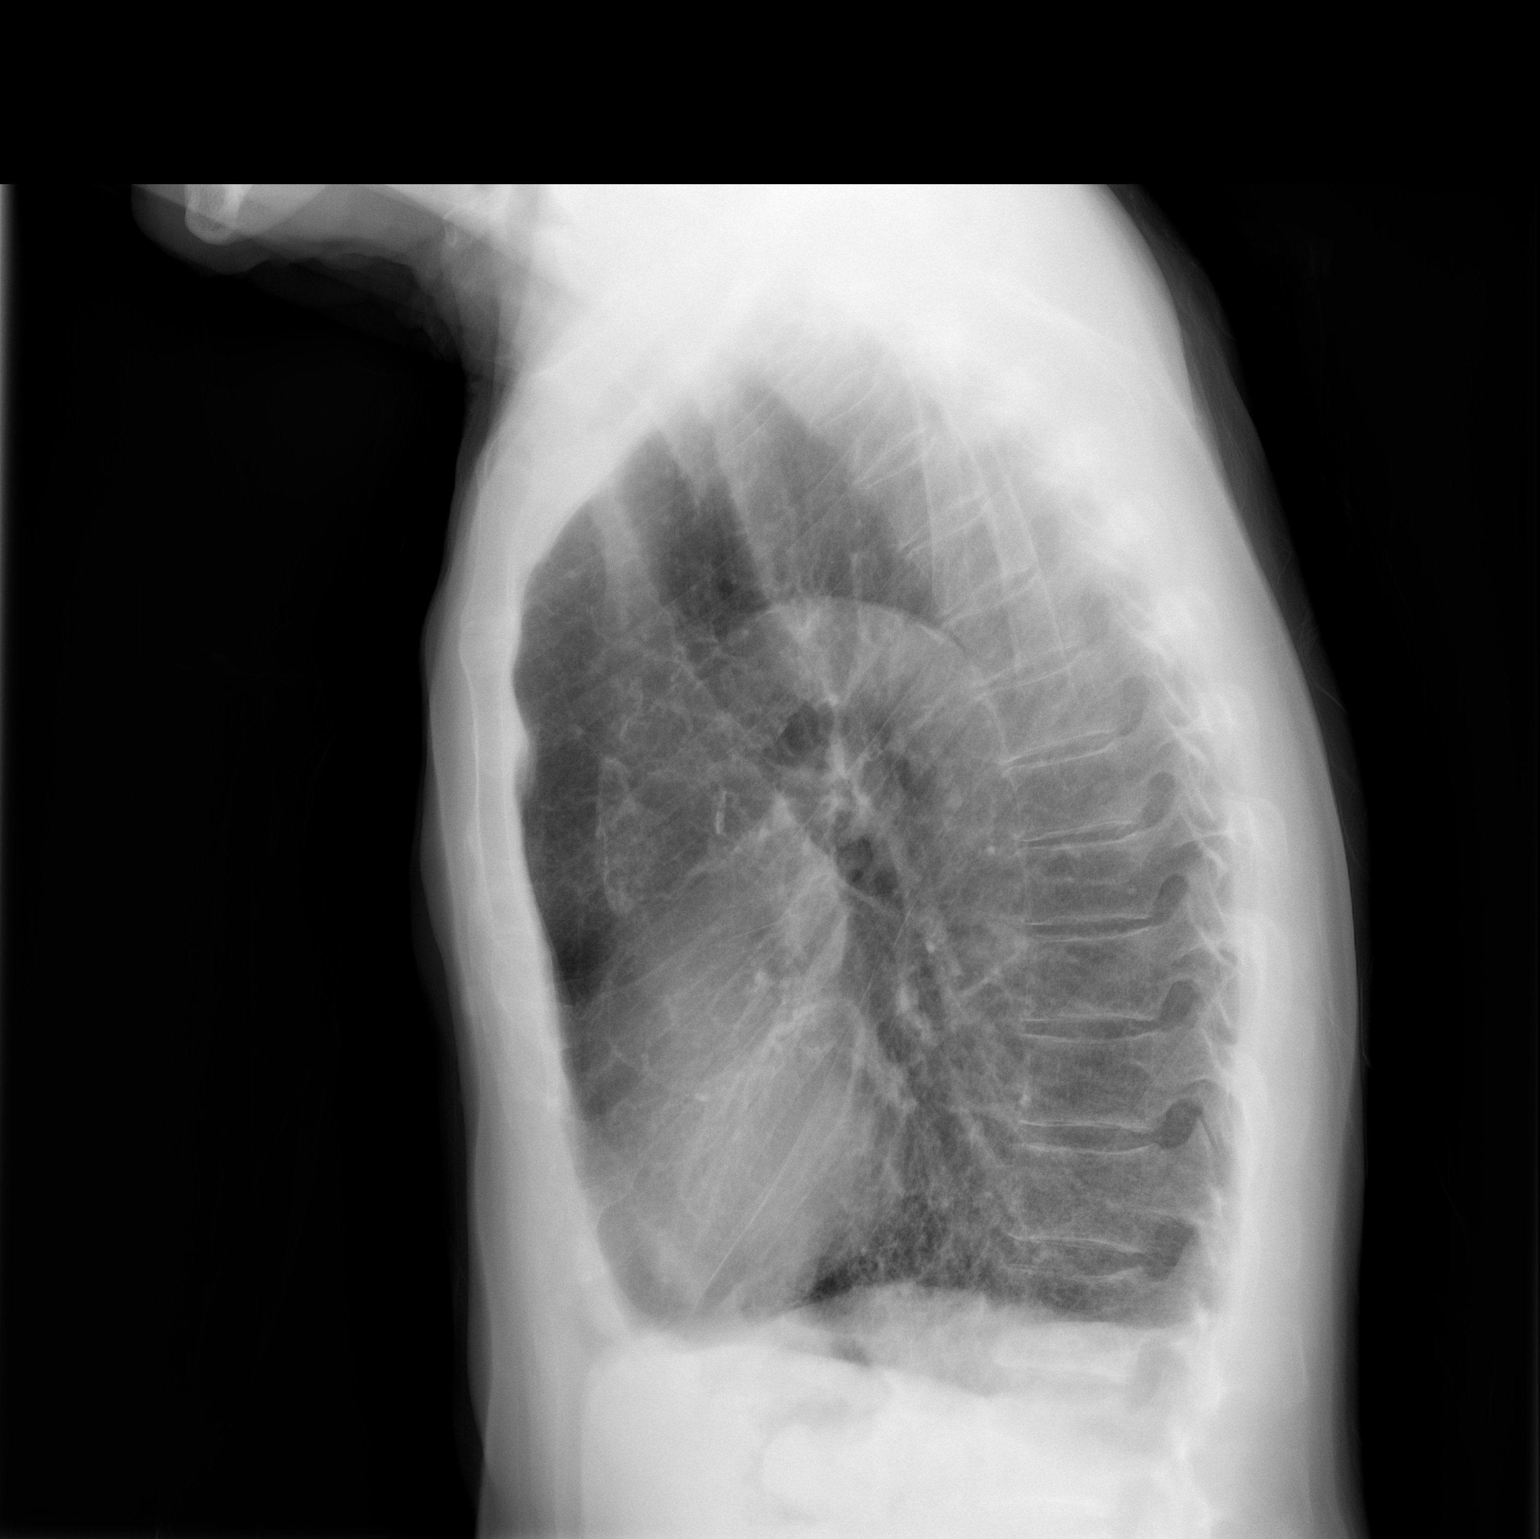

[2 of 2 positions shown; findings below may reference images not displayed]

FINDINGS: Cardiac shadow is within normal limits. Aortic calcifications are
noted. Lungs are hyperinflated consistent with COPD. Biapical
pleuroparenchymal scarring is noted. No focal infiltrate or sizable
effusion is seen. No acute bony abnormality is noted.
IMPRESSION: COPD without acute abnormality.

## 2019-05-21 ENCOUNTER — Telehealth: Payer: Self-pay | Admitting: Cardiovascular Disease

## 2019-05-21 NOTE — Telephone Encounter (Signed)
LVM for patient to call and schedule 1 yr followup with Dr. Berry.
